# Patient Record
Sex: Female | Born: 1980 | Race: Black or African American | Hispanic: No | Smoking: Former smoker
Health system: Southern US, Community
[De-identification: ages and names within clinical notes are randomized; demographics above are authoritative.]

## PROBLEM LIST (undated history)

## (undated) DIAGNOSIS — D649 Anemia, unspecified: Secondary | ICD-10-CM

## (undated) DIAGNOSIS — R87629 Unspecified abnormal cytological findings in specimens from vagina: Secondary | ICD-10-CM

## (undated) DIAGNOSIS — N871 Moderate cervical dysplasia: Secondary | ICD-10-CM

## (undated) HISTORY — DX: Moderate cervical dysplasia: N87.1

## (undated) HISTORY — PX: LEEP: SHX91

## (undated) HISTORY — DX: Anemia, unspecified: D64.9

## (undated) HISTORY — DX: Unspecified abnormal cytological findings in specimens from vagina: R87.629

## (undated) HISTORY — PX: WISDOM TOOTH EXTRACTION: SHX21

---

## 2006-04-25 ENCOUNTER — Emergency Department (HOSPITAL_COMMUNITY): Admission: EM | Admit: 2006-04-25 | Discharge: 2006-04-25 | Payer: Self-pay | Admitting: Emergency Medicine

## 2008-11-15 ENCOUNTER — Emergency Department (HOSPITAL_BASED_OUTPATIENT_CLINIC_OR_DEPARTMENT_OTHER): Admission: EM | Admit: 2008-11-15 | Discharge: 2008-11-15 | Payer: Self-pay | Admitting: Emergency Medicine

## 2008-11-28 ENCOUNTER — Emergency Department (HOSPITAL_BASED_OUTPATIENT_CLINIC_OR_DEPARTMENT_OTHER): Admission: EM | Admit: 2008-11-28 | Discharge: 2008-11-28 | Payer: Self-pay | Admitting: Emergency Medicine

## 2009-12-14 ENCOUNTER — Emergency Department (HOSPITAL_COMMUNITY): Admission: EM | Admit: 2009-12-14 | Discharge: 2009-12-14 | Payer: Self-pay | Admitting: Emergency Medicine

## 2010-06-19 LAB — URINALYSIS, ROUTINE W REFLEX MICROSCOPIC
Leukocytes, UA: NEGATIVE
Nitrite: NEGATIVE
Protein, ur: NEGATIVE mg/dL
Specific Gravity, Urine: 1.01 (ref 1.005–1.030)
Urobilinogen, UA: 0.2 mg/dL (ref 0.0–1.0)

## 2010-06-19 LAB — WET PREP, GENITAL

## 2010-06-19 LAB — RPR: RPR Ser Ql: NONREACTIVE

## 2010-06-19 LAB — URINE MICROSCOPIC-ADD ON

## 2010-06-19 LAB — GC/CHLAMYDIA PROBE AMP, GENITAL
Chlamydia, DNA Probe: NEGATIVE
GC Probe Amp, Genital: NEGATIVE

## 2010-07-13 LAB — COMPREHENSIVE METABOLIC PANEL WITH GFR
ALT: 14 U/L (ref 0–35)
AST: 33 U/L (ref 0–37)
Albumin: 4.1 g/dL (ref 3.5–5.2)
Alkaline Phosphatase: 93 U/L (ref 39–117)
BUN: 8 mg/dL (ref 6–23)
CO2: 26 meq/L (ref 19–32)
Calcium: 9.5 mg/dL (ref 8.4–10.5)
Chloride: 105 meq/L (ref 96–112)
Creatinine, Ser: 0.8 mg/dL (ref 0.4–1.2)
GFR calc non Af Amer: >60 mL/min
Glucose, Bld: 95 mg/dL (ref 70–99)
Potassium: 3.9 meq/L (ref 3.5–5.1)
Sodium: 141 meq/L (ref 135–145)
Total Bilirubin: 0.3 mg/dL (ref 0.3–1.2)
Total Protein: 8.1 g/dL (ref 6.0–8.3)

## 2010-07-13 LAB — URINALYSIS, ROUTINE W REFLEX MICROSCOPIC
Bilirubin Urine: NEGATIVE
Glucose, UA: NEGATIVE mg/dL
Hgb urine dipstick: NEGATIVE
Ketones, ur: NEGATIVE mg/dL
Nitrite: NEGATIVE
Protein, ur: NEGATIVE mg/dL
Specific Gravity, Urine: 1.008 (ref 1.005–1.030)
Urobilinogen, UA: 0.2 mg/dL (ref 0.0–1.0)
pH: 7.5 (ref 5.0–8.0)

## 2010-07-13 LAB — CBC
HCT: 38.7 % (ref 36.0–46.0)
Hemoglobin: 12.9 g/dL (ref 12.0–15.0)
MCHC: 33.3 g/dL (ref 30.0–36.0)
MCV: 90.5 fL (ref 78.0–100.0)
Platelets: 314 10*3/uL (ref 150–400)
RBC: 4.28 MIL/uL (ref 3.87–5.11)
RDW: 12.1 % (ref 11.5–15.5)
WBC: 7.6 10*3/uL (ref 4.0–10.5)

## 2010-07-13 LAB — DIFFERENTIAL
Basophils Absolute: 0.1 10*3/uL (ref 0.0–0.1)
Basophils Relative: 1 % (ref 0–1)
Lymphocytes Relative: 50 % — ABNORMAL HIGH (ref 12–46)
Neutro Abs: 3.3 10*3/uL (ref 1.7–7.7)
Neutrophils Relative %: 43 % (ref 43–77)

## 2010-07-13 LAB — URINE MICROSCOPIC-ADD ON

## 2010-07-13 LAB — PREGNANCY, URINE: Preg Test, Ur: NEGATIVE

## 2011-12-24 ENCOUNTER — Telehealth (HOSPITAL_COMMUNITY): Payer: Self-pay | Admitting: *Deleted

## 2011-12-24 NOTE — Telephone Encounter (Signed)
Telephoned patient and left message to return call to [facility] 

## 2012-01-05 ENCOUNTER — Ambulatory Visit (HOSPITAL_COMMUNITY)
Admission: RE | Admit: 2012-01-05 | Discharge: 2012-01-05 | Disposition: A | Discharge status: Home / Self Care | Payer: Self-pay | Source: Ambulatory Visit | Attending: Obstetrics and Gynecology | Admitting: Obstetrics and Gynecology

## 2012-01-05 ENCOUNTER — Other Ambulatory Visit: Payer: Self-pay | Admitting: Obstetrics and Gynecology

## 2012-01-05 DIAGNOSIS — Z803 Family history of malignant neoplasm of breast: Secondary | ICD-10-CM

## 2012-01-05 DIAGNOSIS — IMO0002 Reserved for concepts with insufficient information to code with codable children: Secondary | ICD-10-CM

## 2012-01-05 DIAGNOSIS — Z1239 Encounter for other screening for malignant neoplasm of breast: Secondary | ICD-10-CM

## 2012-01-05 DIAGNOSIS — N6314 Unspecified lump in the right breast, lower inner quadrant: Secondary | ICD-10-CM

## 2012-01-11 ENCOUNTER — Ambulatory Visit
Admission: RE | Admit: 2012-01-11 | Discharge: 2012-01-11 | Disposition: A | Discharge status: Home / Self Care | Payer: No Typology Code available for payment source | Source: Ambulatory Visit | Attending: Obstetrics and Gynecology | Admitting: Obstetrics and Gynecology

## 2012-01-11 ENCOUNTER — Other Ambulatory Visit: Payer: Self-pay | Admitting: Obstetrics and Gynecology

## 2012-01-11 DIAGNOSIS — Z803 Family history of malignant neoplasm of breast: Secondary | ICD-10-CM

## 2012-01-11 DIAGNOSIS — N6314 Unspecified lump in the right breast, lower inner quadrant: Secondary | ICD-10-CM

## 2012-01-13 ENCOUNTER — Ambulatory Visit (INDEPENDENT_AMBULATORY_CARE_PROVIDER_SITE_OTHER): Payer: Self-pay | Admitting: Obstetrics & Gynecology

## 2012-01-13 ENCOUNTER — Encounter: Payer: Self-pay | Admitting: Obstetrics & Gynecology

## 2012-01-13 ENCOUNTER — Other Ambulatory Visit (HOSPITAL_COMMUNITY)
Admission: RE | Admit: 2012-01-13 | Discharge: 2012-01-13 | Disposition: A | Discharge status: Home / Self Care | Payer: Medicaid Other | Source: Ambulatory Visit | Attending: Obstetrics & Gynecology | Admitting: Obstetrics & Gynecology

## 2012-01-13 VITALS — BP 113/80 | HR 80 | Temp 97.5°F | Resp 20 | Ht 63.0 in | Wt 171.4 lb

## 2012-01-13 DIAGNOSIS — Z01812 Encounter for preprocedural laboratory examination: Secondary | ICD-10-CM

## 2012-01-13 DIAGNOSIS — R87811 Vaginal high risk human papillomavirus (HPV) DNA test positive: Secondary | ICD-10-CM

## 2012-01-13 DIAGNOSIS — N871 Moderate cervical dysplasia: Secondary | ICD-10-CM | POA: Insufficient documentation

## 2012-01-13 DIAGNOSIS — IMO0002 Reserved for concepts with insufficient information to code with codable children: Secondary | ICD-10-CM | POA: Insufficient documentation

## 2012-01-13 LAB — POCT PREGNANCY, URINE: Preg Test, Ur: NEGATIVE

## 2012-01-13 NOTE — Progress Notes (Signed)
ASCUS pap, +HRHPV in 10/23/11. Patient given informed consent, signed copy in the chart, time out was performed.  Placed in lithotomy position. Cervix viewed with speculum and colposcope after application of acetic acid.   Colposcopy adequate? yes Acetowhite lesions? no visible lesions, no mosaicism, no punctation and no abnormal vasculature ECC specimen obtained and sent to pathology.  Patient was given post procedure instructions.  Will follow up pathology and manage accordingly,

## 2012-01-13 NOTE — Patient Instructions (Signed)

## 2012-01-19 ENCOUNTER — Encounter: Payer: Self-pay | Admitting: Obstetrics & Gynecology

## 2012-01-19 ENCOUNTER — Telehealth: Payer: Self-pay | Admitting: Medical

## 2012-01-19 DIAGNOSIS — N871 Moderate cervical dysplasia: Secondary | ICD-10-CM

## 2012-01-19 HISTORY — DX: Moderate cervical dysplasia: N87.1

## 2012-01-19 NOTE — Telephone Encounter (Signed)
Sent message to admin pool for appointment. They will send back appointment information and then we will need to inform patient of results and appointment time.

## 2012-01-19 NOTE — Progress Notes (Signed)
Quick Note:  Colposcopy pathology shows CIN II. Patient needs to come in soon for counseling about cryotherapy vs LEEP. ______

## 2012-01-19 NOTE — Telephone Encounter (Signed)
Message copied by Freddi Starr on Tue Jan 19, 2012 11:25 AM ------      Message from: Jaynie Collins A      Created: Tue Jan 19, 2012 10:43 AM       Colposcopy pathology shows CIN II. Patient needs to come in soon for counseling about cryotherapy vs LEEP.

## 2012-01-20 ENCOUNTER — Telehealth: Payer: Self-pay | Admitting: *Deleted

## 2012-01-20 NOTE — Telephone Encounter (Signed)
Pt returned call and I informed pt that she had an abnormal colposcopy and that we have an appt set up for 02/15/12 @ 145pm so that she could discuss further options.  Pt stated understanding and did not have any other questions.

## 2012-01-20 NOTE — Telephone Encounter (Signed)
Called pt and was unable to leave a message due to "this caller is not accepting calls at this time, please try your call again later." Called [phone] and left message that we are calling in concern of results and a follow up appt please give [place] a return call back.  Pt has scheduled 02/15/12 @ 145pm for leep vs cryo discussion.

## 2012-01-20 NOTE — Telephone Encounter (Signed)
Message copied by Mannie Stabile on Wed Jan 20, 2012  9:50 AM ------      Message from: [place], VANESSA G      Created: Tue Jan 19, 2012 11:56 AM       The Appt is for February 15, 2012@1 :45pm      ----- Message -----         From: Judith Blonder, MA         Sent: 01/19/2012  11:24 AM           To: [facility] Admin Pool            Please schedule appointment with provider who can discuss cryo vs. LEEP. [facility] would be first choice.             Send message back to clinical pool and we will call patient with results and appointment info.             Thanks

## 2012-01-20 NOTE — Telephone Encounter (Signed)
Called patient to inform her of her upcoming appt, got busy tone. Will try again later.

## 2012-01-25 NOTE — Progress Notes (Signed)
Detailed notes scanned into EPIC under media. 

## 2012-01-25 NOTE — Patient Instructions (Signed)
Detailed notes scanned into EPIC under media. 

## 2012-02-15 ENCOUNTER — Ambulatory Visit (INDEPENDENT_AMBULATORY_CARE_PROVIDER_SITE_OTHER): Payer: No Typology Code available for payment source | Admitting: Obstetrics & Gynecology

## 2012-02-15 ENCOUNTER — Encounter: Payer: Self-pay | Admitting: Obstetrics & Gynecology

## 2012-02-15 VITALS — BP 112/76 | HR 101 | Temp 99.6°F | Ht 63.0 in | Wt 172.3 lb

## 2012-02-15 DIAGNOSIS — N871 Moderate cervical dysplasia: Secondary | ICD-10-CM

## 2012-02-15 NOTE — Progress Notes (Signed)
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 Recommended LEEP vs cryosurgery. Risks and benefits of both modalities discussed in detail. Patient desires LEEP. She watched LEEP video, patient education material given to her to review at home. She will return for LEEP soon.  Total encounter time: 15 minutes.

## 2012-02-15 NOTE — Patient Instructions (Signed)
Loop Electrosurgical Excision Procedure Loop electrosurgical excision procedure (LEEP) is the removal of a portion of the lower part of the uterus (cervix). The procedure is done when there are significantly abnormal cervical cell changes. Abnormal cell changes of the cervix can lead to cancer if left in place and untreated.  The LEEP procedure itself typically only takes a few minutes. Often, it may be done in your caregiver's office. The procedure is considered safe for those who wish to get pregnant or are trying to get pregnant. Only under rare circumstances should this procedure be done if you are pregnant. LET YOUR CAREGIVER KNOW ABOUT:  Whether you are pregnant or late for your last menstrual period.  Allergies to foods or medicines.  All the medicines you are taking includingherbs, eyedrops, and over-the-counter medicines, and creams.  Use of steroids (by mouth or creams).  Previous problems with anesthetics or numbing medicine.  Previous gynecological surgery.  History of blood clots or bleeding problems.  Any recent or current vaginal infections (herpes, sexually transmitted infections).  Other health problems. RISKS AND COMPLICATIONS  Bleeding.  Infection.  Injury to the vagina, bladder, or rectum.  Very rare obstruction of the cervical opening that causes problems during menstruation (cervical stenosis). BEFORE THE PROCEDURE  Do not take aspirin or blood thinners (anticoagulants) for 1 week before the procedure, or as told by your caregiver.  Eat a light meal before the procedure.  Ask your caregiver about changing or stopping your regular medicines.  You may be given a pain reliever 1 or 2 hours before the procedure. PROCEDURE   A tool (speculum) is placed in the vagina. This allows your caregiver to see the cervix.  An iodine stain is applied to the cervix to find the area of abnormal cells to be removed.  Medicine is injected to numb the cervix (local  anesthetic).   Electricity is passed through a thin wire loop which is then used to remove (cauterize) a small segment of the affected cervix.  Light electrocautery is used to seal any small blood vessels and prevent bleeding.  A paste may be applied to the cauterized area of the cervix to help prevent bleeding.  The tissue sample is sent to the lab. It is examined under the microscope. AFTER THE PROCEDURE  Have someone drive you home.  You may have slight to moderate cramping.  You may notice a black vaginal discharge from the paste used on the cervix to prevent bleeding. This is normal.  Watch for excessive bleeding. This requires immediate medical care.  Ask when your test results will be ready. Make sure you get your test results. Document Released: 06/13/2002 Document Revised: 06/15/2011 Document Reviewed: 09/02/2010 ExitCare Patient Information 2013 [employer].  

## 2012-03-16 NOTE — Addendum Note (Signed)
Encounter addended by: Trianna Lupien Poll Tiziana Cislo, RN on: 03/16/2012  2:43 PM<BR>     Documentation filed: Charges VN

## 2012-03-23 ENCOUNTER — Encounter: Payer: Self-pay | Admitting: Obstetrics & Gynecology

## 2012-03-23 ENCOUNTER — Ambulatory Visit (INDEPENDENT_AMBULATORY_CARE_PROVIDER_SITE_OTHER): Payer: Self-pay | Admitting: Obstetrics & Gynecology

## 2012-03-23 ENCOUNTER — Other Ambulatory Visit (HOSPITAL_COMMUNITY)
Admission: RE | Admit: 2012-03-23 | Discharge: 2012-03-23 | Disposition: A | Discharge status: Home / Self Care | Payer: Medicaid Other | Source: Ambulatory Visit | Attending: Obstetrics & Gynecology | Admitting: Obstetrics & Gynecology

## 2012-03-23 VITALS — BP 117/78 | HR 90 | Temp 98.6°F | Ht 63.0 in | Wt 169.0 lb

## 2012-03-23 DIAGNOSIS — N871 Moderate cervical dysplasia: Secondary | ICD-10-CM

## 2012-03-23 DIAGNOSIS — N72 Inflammatory disease of cervix uteri: Secondary | ICD-10-CM | POA: Insufficient documentation

## 2012-03-23 LAB — POCT PREGNANCY, URINE: Preg Test, Ur: NEGATIVE

## 2012-03-23 NOTE — Progress Notes (Signed)
Patient ID: Rachel Brown, female   DOB: [birth year], 32 y.o.   MRN: [id number] Pap smear and colposcopy reviewed.   Pap 10/23/11 ASCUS +HRHPV Colpo Biopsy:  Endocervix, curettage, ECC - DETACHED FRAGMENTS OF SQUAMOUS EPITHELIUM, CONSISTENT WITH HIGH GRADE SQUAMOUS INTRAEPITHELIAL LESION, CIN-II (MODERATE DYSPLASIA). ECC see above Risks, benefits, alternatives, and limitations of procedure explained to patient, including pain, bleeding, infection, failure to remove abnormal tissue and failure to cure dysplasia, need for repeat procedures, damage to pelvic organs, cervical incompetence.  Role of HPV,cervical dysplasia and need for close followup was empasized. Informed written consent was obtained. All questions were answered. Time out performed.  ??Procedure: The patient was placed in lithotomy position and the bivalved coated speculum was placed in the patient's vagina. A grounding pad placed on the patient. Local anesthesia was administered via an intracervical block using 10cc of 2% Lidocaine with epinephrine. The suction was turned on and the Small 1X Fisher Cone Biopsy Excisor on 21 Watts of cutting current was used to excise the area of decreased uptake and excise the entire transformation zone. Excellent hemostasis was achieved using roller ball coagulation set at 60 Watts coagulation current. Monsel's solution was then applied and excellent hemostasis was noted.  The speculum was removed from the vagina. Specimens were sent to pathology. ?The patient tolerated the procedure well. Post-operative instructions given to patient, including instruction to seek medical attention for persistent bright red bleeding, fever, abdominal/pelvic pain, dysuria, nausea or vomiting. She was also told about the possibility of having copious yellow to black tinged discharge. She was counseled to avoid anything in the vagina (sex/douching/tampons) for 4 weeks. She has a  4 week post-operative check to review results and  assess wound healing. Follow up in 4 months for repeat pap or as needed.

## 2012-03-23 NOTE — Patient Instructions (Signed)
Loop Electrosurgical Excision Procedure  Care After  Refer to this sheet in the next few weeks. These instructions provide you with information on caring for yourself after your procedure. Your caregiver may also give you more specific instructions. Your treatment has been planned according to current medical practices, but problems sometimes occur. Call your caregiver if you have any problems or questions after your procedure.  HOME CARE INSTRUCTIONS   · Do not use tampons, douche, or have sexual intercourse for 2 weeks or as directed by your caregiver.  · Begin normal activities if you have no or minimal cramping or bleeding, unless directed otherwise by your caregiver.  · Take your temperature if you feel sick. Write down your temperature on paper, and tell your caregiver if you have a fever.  · Take all medicines as directed by your caregiver.  · Keep all your follow-up appointments and Pap tests as directed by your caregiver.  SEEK IMMEDIATE MEDICAL CARE IF:   · You have bleeding that is heavier or longer than a normal menstrual cycle.  · You have bleeding that is bright red.  · You have blood clots.  · You have a fever.  · You have increasing cramps or pain not relieved by medicine.  · You develop abdominal pain that does not seem to be related to the same area of earlier cramping and pain.  · You are lightheaded, unusually weak, or faint.  · You develop painful or bloody urination.  · You develop a bad smelling vaginal discharge.  MAKE SURE YOU:  · Understand these instructions.  · Will watch your condition.  · Will get help right away if you are not doing well or get worse.  Document Released: 12/04/2010 Document Revised: 06/15/2011 Document Reviewed: 12/04/2010  ExitCare® Patient Information ©2013 [employer].

## 2012-03-28 ENCOUNTER — Telehealth: Payer: Self-pay | Admitting: General Practice

## 2012-03-28 NOTE — Telephone Encounter (Signed)
Called patient and informed her that the results from her LEEP came back with no abnormal cells and that she will just need a PAP in 3 months but to come to her January appt to make sure everything was healing well. Patient verbalized understanding and had no further questions

## 2012-03-28 NOTE — Telephone Encounter (Signed)
Message copied by Kathee Delton on Mon Mar 28, 2012  3:25 PM ------      Message from: Willodean Rosenthal      Created: Mon Mar 28, 2012  3:07 PM       Please call pt.  Notify her that  her biopsy  Showed no abnormal cells.  Schedule pt for a repeat PAP in 3 months!            Thx,      clh-S

## 2012-04-20 ENCOUNTER — Encounter: Payer: Self-pay | Admitting: Obstetrics & Gynecology

## 2012-04-20 ENCOUNTER — Ambulatory Visit (INDEPENDENT_AMBULATORY_CARE_PROVIDER_SITE_OTHER): Payer: Medicaid Other | Admitting: Obstetrics & Gynecology

## 2012-04-20 VITALS — BP 116/79 | HR 65 | Temp 98.0°F | Ht 63.0 in | Wt 170.8 lb

## 2012-04-20 DIAGNOSIS — A499 Bacterial infection, unspecified: Secondary | ICD-10-CM

## 2012-04-20 DIAGNOSIS — N879 Dysplasia of cervix uteri, unspecified: Secondary | ICD-10-CM

## 2012-04-20 DIAGNOSIS — B9689 Other specified bacterial agents as the cause of diseases classified elsewhere: Secondary | ICD-10-CM

## 2012-04-20 DIAGNOSIS — N760 Acute vaginitis: Secondary | ICD-10-CM

## 2012-04-20 MED ORDER — METRONIDAZOLE 500 MG PO TABS: 500.0000 mg | ORAL_TABLET | Freq: Two times a day (BID) | ORAL | Status: DC

## 2012-04-20 NOTE — Progress Notes (Signed)
Subjective:     Patient ID: Rachel Brown, female   DOB: 1981-02-27, 32 y.o.   MRN: [id number]  HPI Pt s/p LEEP 03/23/12.  Pt c/o fishy odor with discharge since the procedure.  Pt denies pain.   Review of Systems     Objective:   Physical ExamBP 116/79  Pulse 65  Temp 98 F (36.7 C) (Oral)  Ht 5\' 3"  (1.6 m)  Wt 170 lb 12.8 oz (77.474 kg)  BMI 30.26 kg/m2  LMP 04/14/2012 GU: EGBUS: no lesions Vagina: no blood in vault Cervix: no lesion; no mucopurulent d/c; cervix still healing  Cervix, LEEP - TRANSFORMATION ZONE MUCOSA WITH ACUTE AND CHRONIC CERVICITIS AND FOCAL REACTIVE CHANGES. - NO DYSPLASIA OR MALIGNANCY IDENTIFIED.      Assessment:     BV S/p LEEP- no dysplasia identified on path specimen.  Will repeat PAP in 3 months    Plan:     Flagyl 500mg  1 po bid x 7 days F/u 3 months for repeat PAP only

## 2012-04-20 NOTE — Patient Instructions (Signed)
Bacterial Vaginosis Bacterial vaginosis (BV) is a vaginal infection where the normal balance of bacteria in the vagina is disrupted. The normal balance is then replaced by an overgrowth of certain bacteria. There are several different kinds of bacteria that can cause BV. BV is the most common vaginal infection in women of childbearing age. CAUSES   The cause of BV is not fully understood. BV develops when there is an increase or imbalance of harmful bacteria.  Some activities or behaviors can upset the normal balance of bacteria in the vagina and put women at increased risk including:  Having a new sex partner or multiple sex partners.  Douching.  Using an intrauterine device (IUD) for contraception.  It is not clear what role sexual activity plays in the development of BV. However, women that have never had sexual intercourse are rarely infected with BV. Women do not get BV from toilet seats, bedding, swimming pools or from touching objects around them.  SYMPTOMS   Grey vaginal discharge.  A fish-like odor with discharge, especially after sexual intercourse.  Itching or burning of the vagina and vulva.  Burning or pain with urination.  Some women have no signs or symptoms at all. DIAGNOSIS  Your caregiver must examine the vagina for signs of BV. Your caregiver will perform lab tests and look at the sample of vaginal fluid through a microscope. They will look for bacteria and abnormal cells (clue cells), a pH test higher than 4.5, and a positive amine test all associated with BV.  RISKS AND COMPLICATIONS   Pelvic inflammatory disease (PID).  Infections following gynecology surgery.  Developing HIV.  Developing herpes virus. TREATMENT  Sometimes BV will clear up without treatment. However, all women with symptoms of BV should be treated to avoid complications, especially if gynecology surgery is planned. Female partners generally do not need to be treated. However, BV may spread  between female sex partners so treatment is helpful in preventing a recurrence of BV.   BV may be treated with antibiotics. The antibiotics come in either pill or vaginal cream forms. Either can be used with nonpregnant or pregnant women, but the recommended dosages differ. These antibiotics are not harmful to the baby.  BV can recur after treatment. If this happens, a second round of antibiotics will often be prescribed.  Treatment is important for pregnant women. If not treated, BV can cause a premature delivery, especially for a pregnant woman who had a premature birth in the past. All pregnant women who have symptoms of BV should be checked and treated.  For chronic reoccurrence of BV, treatment with a type of prescribed gel vaginally twice a week is helpful. HOME CARE INSTRUCTIONS   Finish all medication as directed by your caregiver.  Do not have sex until treatment is completed.  Tell your sexual partner that you have a vaginal infection. They should see their caregiver and be treated if they have problems, such as a mild rash or itching.  Practice safe sex. Use condoms. Only have 1 sex partner. PREVENTION  Basic prevention steps can help reduce the risk of upsetting the natural balance of bacteria in the vagina and developing BV:  Do not have sexual intercourse (be abstinent).  Do not douche.  Use all of the medicine prescribed for treatment of BV, even if the signs and symptoms go away.  Tell your sex partner if you have BV. That way, they can be treated, if needed, to prevent reoccurrence. SEEK MEDICAL CARE IF:     Your symptoms are not improving after 3 days of treatment.  You have increased discharge, pain, or fever. MAKE SURE YOU:   Understand these instructions.  Will watch your condition.  Will get help right away if you are not doing well or get worse. FOR MORE INFORMATION  Division of STD Prevention (DSTDP), Centers for Disease Control and Prevention:  SolutionApps.co.za American Social Health Association (ASHA): www.ashastd.org  Document Released: 03/23/2005 Document Revised: 06/15/2011 Document Reviewed: 09/13/2008 [facility] Patient Information 2013 [place], [place]. Loop Electrosurgical Excision Procedure Care After Refer to this sheet in the next few weeks. These instructions provide you with information on caring for yourself after your procedure. Your caregiver may also give you more specific instructions. Your treatment has been planned according to current medical practices, but problems sometimes occur. Call your caregiver if you have any problems or questions after your procedure. HOME CARE INSTRUCTIONS   Do not use tampons, douche, or have sexual intercourse for 2 weeks or as directed by your caregiver.  Begin normal activities if you have no or minimal cramping or bleeding, unless directed otherwise by your caregiver.  Take your temperature if you feel sick. Write down your temperature on paper, and tell your caregiver if you have a fever.  Take all medicines as directed by your caregiver.  Keep all your follow-up appointments and Pap tests as directed by your caregiver. SEEK IMMEDIATE MEDICAL CARE IF:   You have bleeding that is heavier or longer than a normal menstrual cycle.  You have bleeding that is bright red.  You have blood clots.  You have a fever.  You have increasing cramps or pain not relieved by medicine.  You develop abdominal pain that does not seem to be related to the same area of earlier cramping and pain.  You are lightheaded, unusually weak, or faint.  You develop painful or bloody urination.  You develop a bad smelling vaginal discharge. MAKE SURE YOU:  Understand these instructions.  Will watch your condition.  Will get help right away if you are not doing well or get worse. Document Released: 12/04/2010 Document Revised: 06/15/2011 Document Reviewed: 12/04/2010 [facility] Patient Information  2013 [place], [place].

## 2012-08-04 ENCOUNTER — Ambulatory Visit (INDEPENDENT_AMBULATORY_CARE_PROVIDER_SITE_OTHER): Payer: Medicaid Other | Admitting: Obstetrics & Gynecology

## 2012-08-04 ENCOUNTER — Encounter: Payer: Self-pay | Admitting: Obstetrics & Gynecology

## 2012-08-04 VITALS — BP 120/84 | HR 106 | Temp 98.4°F | Ht 63.0 in | Wt 170.7 lb

## 2012-08-04 DIAGNOSIS — R8789 Other abnormal findings in specimens from female genital organs: Secondary | ICD-10-CM

## 2012-08-04 DIAGNOSIS — Z9889 Other specified postprocedural states: Secondary | ICD-10-CM

## 2012-08-04 NOTE — Progress Notes (Signed)
Subjective:     Patient ID: Rachel Brown, female   DOB: 06-Jun-1980, 32 y.o.   MRN: [id number]  HPI  Pt s/p LEEP which showed no dysplasia.  She is here for a repeat PAP.  Pt sexually active and wants STI screen.   Review of Systems     Objective:   Physical Exam BP 120/84  Pulse 106  Temp(Src) 98.4 F (36.9 C) (Oral)  Ht 5\' 3"  (1.6 m)  Wt 170 lb 11.2 oz (77.429 kg)  BMI 30.25 kg/m2  LMP 07/08/2012 Pt in NAD GU: EGBUS: no lesions Vagina: no blood in vault Cervix: no lesion; no mucopurulent d/c  Pap 10/23/11 ASCUS +HRHPV  Colpo Biopsy:  Endocervix, curettage, ECC  - DETACHED FRAGMENTS OF SQUAMOUS EPITHELIUM, CONSISTENT WITH HIGH GRADE  SQUAMOUS INTRAEPITHELIAL LESION, CIN-II (MODERATE DYSPLASIA).   03/23/12 Diagnosis Cervix, LEEP - TRANSFORMATION ZONE MUCOSA WITH ACUTE AND CHRONIC CERVICITIS AND FOCAL REACTIVE CHANGES. - NO DYSPLASIA OR MALIGNANCY IDENTIFIED.     Assessment:     F/u abnormal PAP/colpo- LEEP path was not consistent with ECC.   STI screen     Plan:     F/u PAP and cx F/u 1 year annual if PAP normal

## 2012-08-04 NOTE — Patient Instructions (Signed)
Abnormal Pap Test Information During a Pap test, the cells on the surface of your cervix are checked to see if they look normal, abnormal, or if they show signs of having been altered by a certain type of virus called human papillomavirus, or HPV. Cervical cells that have been affected by HPV are called dysplasia. Dysplasia is not cancer, but describes abnormal cells found on the surface of the cervix. Depending on the degree of dysplasia, some of the cells may be considered pre-cancerous and may turn into cancer over time if follow up with a caregiver is delayed.  WHAT DOES AN ABNORMAL PAP TEST MEAN? Having an abnormal pap test does not mean that you have cancer. However, certain types of abnormal pap tests can be a sign that a person is at a higher risk of developing cancer. Your caregiver will want to do other tests to find out more about the abnormal cells. Your abnormal Pap test results could show:   Small and uncertain changes that should be carefully watched.   Cervical dysplasia that has caused mild changes and can be followed over time.  Cervical dysplasia that is more severe and needs to be followed and treated to ensure the problem goes away.  Cancer.  When severe cervical dysplasia is found and treated early, it rarely will grow into cancer.  WHAT WILL BE DONE ABOUT MY ABNORMAL PAP TEST?  A colposcopy may be needed. This is a procedure where your cervix is examined using light and magnification.  A small tissue sample of your cervix (biopsy) may need to be removed and then examined. This is often performed if there are areas that appear infected.  A sample of cells from the cervical canal may be removed with either a small brush or scraping instrument (curette). Based on the results of the procedures above, some caregivers may recommend either cryotherapy of the cervix or a surgical LEEP where a portion of the cervix is removed. LEEP is short for "loop electrical excisional  procedure." Rarely, a caregiver may recommend a cone biopsy.This is a procedure where a small, cone-shaped sample of your cervix is taken out. The part that is taken out is the area where the abnormal cells are.  WHAT IF I HAVE A DYSPLASIA OR A CANCER? You may be referred to a specialist. Radiation may also be a treatment for more advanced cancer. Having a hysterectomy is the last treatment option for dysplasia, but it is a more common treatment for someone with cancer. All treatment options will be discussed with you by your caregiver. WHAT SHOULD YOU DO AFTER BEING TREATED? If you have had an abnormal pap test, you should continue to have regular pap tests and check-ups as directed by your caregiver. Your cervical problem will be carefully watched so it does not get worse. Also, your caregiver can watch for, and treat, any new problems that may come up. Document Released: 07/08/2010 Document Revised: 06/15/2011 Document Reviewed: 03/19/2011 [facility] Patient Information 2013 [place], [place]. Sexually Transmitted Disease Sexually transmitted disease (STD) refers to any infection that is passed from person to person during sexual activity. This may happen by way of saliva, semen, blood, vaginal mucus, or urine. Common STDs include:  Gonorrhea.  Chlamydia.  Syphilis.  HIV/AIDS.  Genital herpes.  Hepatitis B and C.  Trichomonas.  Human papillomavirus (HPV).  Pubic lice. CAUSES  An STD may be spread by bacteria, virus, or parasite. A person can get an STD by:  Sexual intercourse with an infected  person.  Sharing sex toys with an infected person.  Sharing needles with an infected person.  Having intimate contact with the genitals, mouth, or rectal areas of an infected person. SYMPTOMS  Some people may not have any symptoms, but they can still pass the infection to others. Different STDs have different symptoms. Symptoms include:  Painful or bloody urination.  Pain in the  pelvis, abdomen, vagina, anus, throat, or eyes.  Skin rash, itching, irritation, growths, or sores (lesions). These usually occur in the genital or anal area.  Abnormal vaginal discharge.  Penile discharge in men.  Soft, flesh-colored skin growths in the genital or anal area.  Fever.  Pain or bleeding during sexual intercourse.  Swollen glands in the groin area.  Yellow skin and eyes (jaundice). This is seen with hepatitis. DIAGNOSIS  To make a diagnosis, your caregiver may:  Take a medical history.  Perform a physical exam.  Take a specimen (culture) to be examined.  Examine a sample of discharge under a microscope.  Perform blood tests.  Perform a Pap test, if this applies.  Perform a colposcopy.  Perform a laparoscopy. TREATMENT   Chlamydia, gonorrhea, trichomonas, and syphilis can be cured with antibiotic medicine.  Genital herpes, hepatitis, and HIV can be treated, but not cured, with prescribed medicines. The medicines will lessen the symptoms.  Genital warts from HPV can be treated with medicine or by freezing, burning (electrocautery), or surgery. Warts may come back.  HPV is a virus and cannot be cured with medicine or surgery.However, abnormal areas may be followed very closely by your caregiver and may be removed from the cervix, vagina, or vulva through office procedures or surgery. If your diagnosis is confirmed, your recent sexual partners need treatment. This is true even if they are symptom-free or have a negative culture or evaluation. They should not have sex until their caregiver says it is okay. HOME CARE INSTRUCTIONS  All sexual partners should be informed, tested, and treated for all STDs.  Take your antibiotics as directed. Finish them even if you start to feel better.  Only take over-the-counter or prescription medicines for pain, discomfort, or fever as directed by your caregiver.  Rest.  Eat a balanced diet and drink enough fluids to  keep your urine clear or pale yellow.  Do not have sex until treatment is completed and you have followed up with your caregiver. STDs should be checked after treatment.  Keep all follow-up appointments, Pap tests, and blood tests as directed by your caregiver.  Only use latex condoms and water-soluble lubricants during sexual activity. Do not use petroleum jelly or oils.  Avoid alcohol and illegal drugs.  Get vaccinated for HPV and hepatitis. If you have not received these vaccines in the past, talk to your caregiver about whether one or both might be right for you.  Avoid risky sex practices that can break the skin. The only way to avoid getting an STD is to avoid all sexual activity.Latex condoms and dental dams (for oral sex) will help lessen the risk of getting an STD, but will not completely eliminate the risk. SEEK MEDICAL CARE IF:   You have a fever.  You have any new or worsening symptoms. Document Released: 06/13/2002 Document Revised: 06/15/2011 Document Reviewed: 06/20/2010 [facility] Patient Information 2013 [place], [place].

## 2013-09-24 ENCOUNTER — Encounter (HOSPITAL_BASED_OUTPATIENT_CLINIC_OR_DEPARTMENT_OTHER): Payer: Self-pay | Admitting: Emergency Medicine

## 2013-09-24 ENCOUNTER — Emergency Department (HOSPITAL_BASED_OUTPATIENT_CLINIC_OR_DEPARTMENT_OTHER)
Admission: EM | Admit: 2013-09-24 | Discharge: 2013-09-24 | Disposition: A | Discharge status: Home / Self Care | Payer: BC Managed Care – PPO | Attending: Emergency Medicine | Admitting: Emergency Medicine

## 2013-09-24 DIAGNOSIS — IMO0002 Reserved for concepts with insufficient information to code with codable children: Secondary | ICD-10-CM | POA: Insufficient documentation

## 2013-09-24 DIAGNOSIS — Z8742 Personal history of other diseases of the female genital tract: Secondary | ICD-10-CM | POA: Insufficient documentation

## 2013-09-24 DIAGNOSIS — Z91040 Latex allergy status: Secondary | ICD-10-CM | POA: Insufficient documentation

## 2013-09-24 DIAGNOSIS — R110 Nausea: Secondary | ICD-10-CM | POA: Insufficient documentation

## 2013-09-24 DIAGNOSIS — Z79899 Other long term (current) drug therapy: Secondary | ICD-10-CM | POA: Insufficient documentation

## 2013-09-24 DIAGNOSIS — Z87891 Personal history of nicotine dependence: Secondary | ICD-10-CM | POA: Insufficient documentation

## 2013-09-24 DIAGNOSIS — L03011 Cellulitis of right finger: Secondary | ICD-10-CM

## 2013-09-24 DIAGNOSIS — Z862 Personal history of diseases of the blood and blood-forming organs and certain disorders involving the immune mechanism: Secondary | ICD-10-CM | POA: Insufficient documentation

## 2013-09-24 MED ORDER — SULFAMETHOXAZOLE-TRIMETHOPRIM 800-160 MG PO TABS
1.0000 | ORAL_TABLET | Freq: Two times a day (BID) | ORAL | Status: AC
Start: 2013-09-24 — End: 2013-10-01

## 2013-09-24 MED ORDER — TERBINAFINE HCL 250 MG PO TABS: 250.0000 mg | ORAL_TABLET | Freq: Every day | ORAL | Status: DC

## 2013-09-24 MED ORDER — HYDROCODONE-ACETAMINOPHEN 5-325 MG PO TABS: 2.0000 | ORAL_TABLET | ORAL | Status: DC | PRN

## 2013-09-24 NOTE — ED Notes (Signed)
Patient has been having right thumb pain that has been progressing. States this morning she went to her nail salon to have her gel nail removed and she noticed her thumbnail was discolored. Pain started 2 days ago  And now shoots up her hand.

## 2013-09-24 NOTE — ED Provider Notes (Signed)
CSN: [id number]     Arrival date & time 09/24/13  1322 History   First MD Initiated Contact with Patient 09/24/13 1329     Chief Complaint  Patient presents with  . Nail Problem     (Consider location/radiation/quality/duration/timing/severity/associated sxs/prior Treatment) Patient is a 33 y.o. female presenting with hand pain. The history is provided by the patient. No language interpreter was used.  Hand Pain This is a new problem. The current episode started in the past 7 days. Associated symptoms include nausea. Pertinent negatives include no chills. Nothing aggravates the symptoms. She has tried nothing for the symptoms. The treatment provided moderate relief.  Pt has white streak under nail.   Pt reports paionful  Past Medical History  Diagnosis Date  . Anemia     history  . 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   History reviewed. No pertinent past surgical history. Family History  Problem Relation Age of Onset  . Stroke Father   . Hypertension Father   . Heart disease Father   . Liver disease Father   . Cancer Mother     breast  . Asthma Sister    History  Substance Use Topics  . Smoking status: Former Smoker    Quit date: 11/24/2006  . Smokeless tobacco: Not on file  . Alcohol Use: Yes     Comment: occasionally   OB History   Grav Para Term Preterm Abortions TAB SAB Ect Mult Living   0              Review of Systems  Constitutional: Negative for chills.  Gastrointestinal: Positive for nausea.      Allergies  Asa; Latex; Nsaids; and Peanuts  Home Medications   Prior to Admission medications   Medication Sig Start Date End Date Taking? Authorizing Provider  etonogestrel-ethinyl estradiol (NUVARING) 0.12-0.015 MG/24HR vaginal ring Place 1 each vaginally every 28 (twenty-eight) days. Insert vaginally and leave in place for 3 consecutive weeks, then remove for 1 week.    Historical  Provider, MD  metroNIDAZOLE (FLAGYL) 500 MG tablet Take 1 tablet (500 mg total) by mouth 2 (two) times daily. 04/20/12   Willodean Rosenthalarolyn Harraway-Smith, MD  sulfamethoxazole-trimethoprim (BACTRIM DS,SEPTRA DS) 800-160 MG per tablet Take 1 tablet by mouth 2 (two) times daily. 09/24/13 10/01/13  Elson AreasLeslie K Sofia, PA-C  terbinafine (LAMISIL) 250 MG tablet Take 1 tablet (250 mg total) by mouth daily. 09/24/13   Elson AreasLeslie K Sofia, PA-C  triamcinolone ointment (KENALOG) 0.1 % Apply 1 application topically 2 (two) times daily.    Historical Provider, MD   BP 140/80  Pulse 90  Temp(Src) 98.4 F (36.9 C) (Oral)  Resp 18  Ht 5\' 3"  (1.6 m)  Wt 173 lb (78.472 kg)  BMI 30.65 kg/m2  SpO2 99%  LMP 09/04/2013 Physical Exam  Constitutional: She appears well-developed.  HENT:  Head: Normocephalic.  Musculoskeletal: She exhibits tenderness.  White streak under nail,  Finger swollen  Neurological: She is alert.  Skin: Skin is warm.  Psychiatric: She has a normal mood and affect.    ED Course  Procedures (including critical care time) Labs Review Labs Reviewed - No data to display  Imaging Review No results found.   EKG Interpretation None      MDM I suspect fungal infection,   I am going to cover with Bactrim and start Lamiseal   Final diagnoses:  Infection of nail bed of  finger, right    Bactrim and lamiseal    Elson AreasLeslie K Sofia, PA-C 09/24/13 1403

## 2013-09-24 NOTE — Discharge Instructions (Signed)
Wound Infection °A wound infection happens when a type of germ (bacteria) starts growing in the wound. In some cases, this can cause the wound to break open. If cared for properly, the infected wound will heal from the inside to the outside. Wound infections need treatment. °CAUSES °An infection is caused by bacteria growing in the wound.  °SYMPTOMS  °· Increase in redness, swelling, or pain at the wound site. °· Increase in drainage at the wound site. °· Wound or bandage (dressing) starts to smell bad. °· Fever. °· Feeling tired or fatigued. °· Pus draining from the wound. °TREATMENT  °Your health care provider will prescribe antibiotic medicine. The wound infection should improve within 24 to 48 hours. Any redness around the wound should stop spreading and the wound should be less painful.  °HOME CARE INSTRUCTIONS  °· Only take over-the-counter or prescription medicines for pain, discomfort, or fever as directed by your health care provider. °· Take your antibiotics as directed. Finish them even if you start to feel better. °· Gently wash the area with mild soap and water 2 times a day, or as directed. Rinse off the soap. Pat the area dry with a clean towel. Do not rub the wound. This may cause bleeding. °· Follow your health care provider's instructions for how often you need to change the dressing. °· Apply ointment and a dressing to the wound as directed. °· If the dressing sticks, moisten it with soapy water and gently remove it. °· Change the bandage right away if it becomes wet, dirty, or develops a bad smell. °· Take showers. Do not take tub baths, swim, or do anything that may soak the wound until it is healed. °· Avoid exercises that make you sweat heavily. °· Use anti-itch medicine as directed by your health care provider. The wound may itch when it is healing. Do not pick or scratch at the wound. °· Follow up with your health care provider to get your wound rechecked as directed. °SEEK MEDICAL CARE  IF: °· You have an increase in swelling, pain, or redness around the wound. °· You have an increase in the amount of pus coming from the wound. °· There is a bad smell coming from the wound. °· More of the wound breaks open. °· You have a fever. °MAKE SURE YOU:  °· Understand these instructions. °· Will watch your condition. °· Will get help right away if you are not doing well or get worse. °Document Released: 12/20/2002 Document Revised: 03/28/2013 Document Reviewed: 07/27/2010 °[employer]® Patient Information ©2015 [employer]. This information is not intended to replace advice given to you by your health care provider. Make sure you discuss any questions you have with your health care provider. ° °

## 2013-09-24 NOTE — ED Provider Notes (Signed)
Medical screening examination/treatment/procedure(s) were performed by non-physician practitioner and as supervising physician I was immediately available for consultation/collaboration.     Geoffery Lyonsouglas Delo, MD 09/24/13 [phone]

## 2013-09-27 ENCOUNTER — Encounter (HOSPITAL_COMMUNITY): Payer: Self-pay | Admitting: Emergency Medicine

## 2013-09-27 ENCOUNTER — Telehealth (HOSPITAL_BASED_OUTPATIENT_CLINIC_OR_DEPARTMENT_OTHER): Payer: Self-pay

## 2013-09-27 ENCOUNTER — Emergency Department (INDEPENDENT_AMBULATORY_CARE_PROVIDER_SITE_OTHER)
Admission: EM | Admit: 2013-09-27 | Discharge: 2013-09-27 | Disposition: A | Discharge status: Home / Self Care | Payer: BC Managed Care – PPO | Source: Home / Self Care | Attending: Emergency Medicine | Admitting: Emergency Medicine

## 2013-09-27 DIAGNOSIS — L03011 Cellulitis of right finger: Secondary | ICD-10-CM

## 2013-09-27 DIAGNOSIS — L03019 Cellulitis of unspecified finger: Secondary | ICD-10-CM

## 2013-09-27 MED ORDER — TERBINAFINE HCL 250 MG PO TABS: 250.0000 mg | ORAL_TABLET | Freq: Every day | ORAL | Status: DC

## 2013-09-27 NOTE — Telephone Encounter (Signed)
Pharmacy calling regarding Rx.  [facility]

## 2013-09-27 NOTE — ED Notes (Signed)
Patient c/o feeling lightheaded post procedure.  Reclined patient and provided ice/coke.  Reports feling better.  Continues to soak finger in betadine

## 2013-09-27 NOTE — ED Provider Notes (Addendum)
CSN: [id number]     Arrival date & time 09/27/13  0944 History   First MD Initiated Contact with Patient 09/27/13 1001     Chief Complaint  Patient presents with  . Follow-up   (Consider location/radiation/quality/duration/timing/severity/associated sxs/prior Treatment) Patient is a 33 y.o. female presenting with hand pain. The history is provided by the patient. No language interpreter was used.  Hand Pain This is a recurrent problem. The problem occurs every several days. Nothing aggravates the symptoms. Nothing relieves the symptoms.  Pt here for recheck with me.   Pt has had decreased swelling of finger,  But nail looks worse.  Pt unable to get lamiseal  Past Medical History  Diagnosis Date  . Anemia     history  . 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   History reviewed. No pertinent past surgical history. Family History  Problem Relation Age of Onset  . Stroke Father   . Hypertension Father   . Heart disease Father   . Liver disease Father   . Cancer Mother     breast  . Asthma Sister    History  Substance Use Topics  . Smoking status: Former Smoker    Quit date: 11/24/2006  . Smokeless tobacco: Not on file  . Alcohol Use: Yes     Comment: occasionally   OB History   Grav Para Term Preterm Abortions TAB SAB Ect Mult Living   0              Review of Systems  Skin: Positive for wound.  All other systems reviewed and are negative.   Allergies  Asa; Latex; Nsaids; and Peanuts  Home Medications   Prior to Admission medications   Medication Sig Start Date End Date Taking? Authorizing Provider  sulfamethoxazole-trimethoprim (BACTRIM DS,SEPTRA DS) 800-160 MG per tablet Take 1 tablet by mouth 2 (two) times daily. 09/24/13 10/01/13 Yes Elson AreasLeslie K Sofia, PA-C  etonogestrel-ethinyl estradiol (NUVARING) 0.12-0.015 MG/24HR vaginal ring Place 1 each vaginally every 28 (twenty-eight) days. Insert  vaginally and leave in place for 3 consecutive weeks, then remove for 1 week.    Historical Provider, MD  HYDROcodone-acetaminophen (NORCO/VICODIN) 5-325 MG per tablet Take 2 tablets by mouth every 4 (four) hours as needed. 09/24/13   Elson AreasLeslie K Sofia, PA-C  metroNIDAZOLE (FLAGYL) 500 MG tablet Take 1 tablet (500 mg total) by mouth 2 (two) times daily. 04/20/12   Willodean Rosenthalarolyn Harraway-Smith, MD  terbinafine (LAMISIL) 250 MG tablet Take 1 tablet (250 mg total) by mouth daily. 09/27/13   Elson AreasLeslie K Sofia, PA-C  triamcinolone ointment (KENALOG) 0.1 % Apply 1 application topically 2 (two) times daily.    Historical Provider, MD   BP 119/87  Pulse 73  Temp(Src) 97.7 F (36.5 C) (Oral)  Resp 16  SpO2 98%  LMP 08/31/2013 Physical Exam  Nursing note reviewed. Constitutional: She is oriented to person, place, and time.  Musculoskeletal: She exhibits tenderness.  Neurological: She is alert and oriented to person, place, and time.  Skin: Skin is warm.    ED Course  INCISION AND DRAINAGE Date/Time: 09/27/2013 11:45 AM Performed by: Elson AreasSOFIA, LESLIE K Authorized by: Elson AreasSOFIA, LESLIE K Consent: Verbal consent not obtained. Risks and benefits: risks, benefits and alternatives were discussed Consent given by: patient Patient identity confirmed: verbally with patient Type: abscess Body area: upper extremity Anesthesia: local infiltration Local anesthetic: topical anesthetic and lidocaine spray Needle gauge: 18 Incision type: single  straight Complexity: simple Drainage: purulent Patient tolerance: Patient tolerated the procedure well with no immediate complications.   (including critical care time) Labs Review Labs Reviewed - No data to display  Imaging Review No results found. Swollen area at base of nail  MDM   1. Paronychia of finger of right hand    Continue bactrim rx for lamiseal,  [facility]    Leslie K Sofia, PA-C 09/27/13 1148  [address]Leslie K SaugatuckSofia, PA-C 09/27/13  1148  [address]Leslie K Windsor HeightsSofia, [place] JerseyPA-C 10/02/13 1724

## 2013-09-27 NOTE — ED Notes (Signed)
Patient instructed to return for follow up.  Seen at [facility] on 6/21.

## 2013-09-27 NOTE — Discharge Instructions (Signed)

## 2013-09-28 NOTE — ED Provider Notes (Signed)
Medical screening examination/treatment/procedure(s) were performed by a resident physician or non-physician practitioner and as the supervising physician I was immediately available for consultation/collaboration.  Circe Chilton, MD    Yacqub Baston S Lamine Laton, MD 09/28/13 0728 

## 2013-10-03 NOTE — ED Provider Notes (Signed)
Medical screening examination/treatment/procedure(s) were performed by a resident physician or non-physician practitioner and as the supervising physician I was immediately available for consultation/collaboration.  Clementeen GrahamEvan Corey, MD    Rodolph BongEvan S Corey, MD 10/03/13 [phone]

## 2013-11-30 ENCOUNTER — Other Ambulatory Visit: Payer: Self-pay

## 2013-12-01 LAB — CYTOLOGY - PAP

## 2014-09-27 IMAGING — MG MM DIGITAL DIAGNOSTIC BILAT
5 series · 5 of 5 positions shown · non-contrast
Comparison: None.

CLINICAL DATA: Palpable abnormality in the right breast.  The
patient has a strong family history breast cancer (the patient's
mother was diagnosed with breast cancer at the age of 39).

DIGITAL DIAGNOSTIC BILATERAL MAMMOGRAM WITH CAD AND RIGHT BREAST
ULTRASOUND:

[R CC]
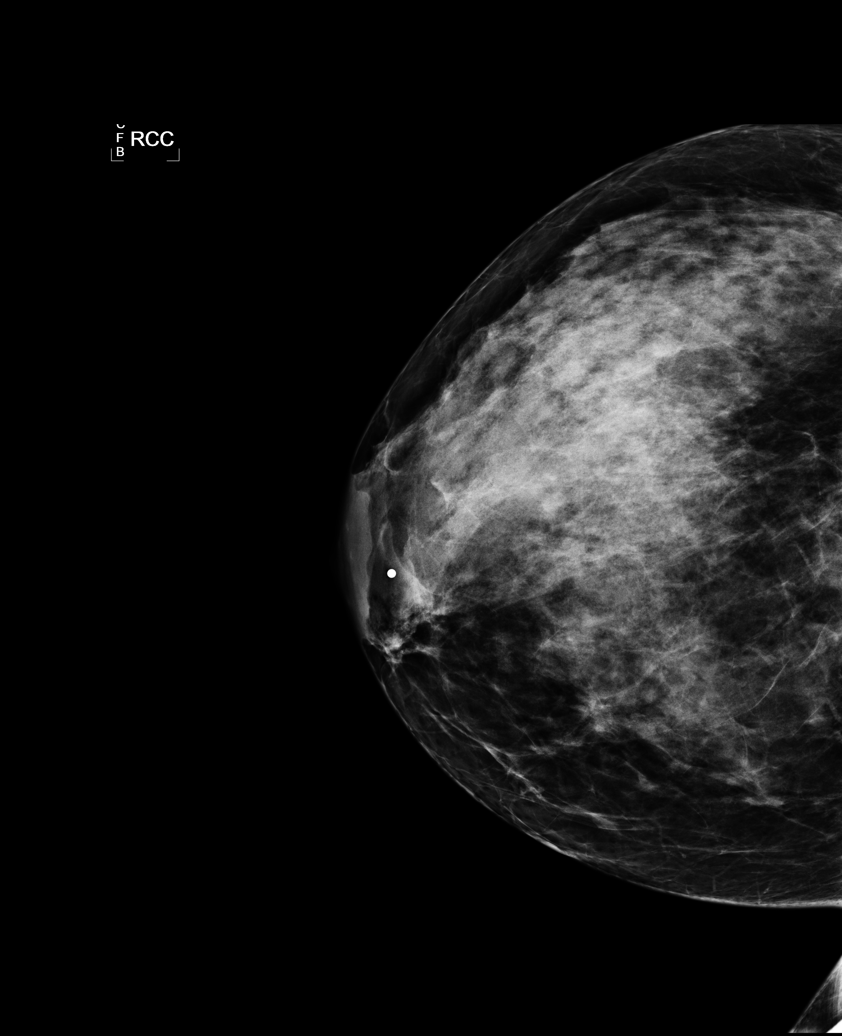

[L CC]
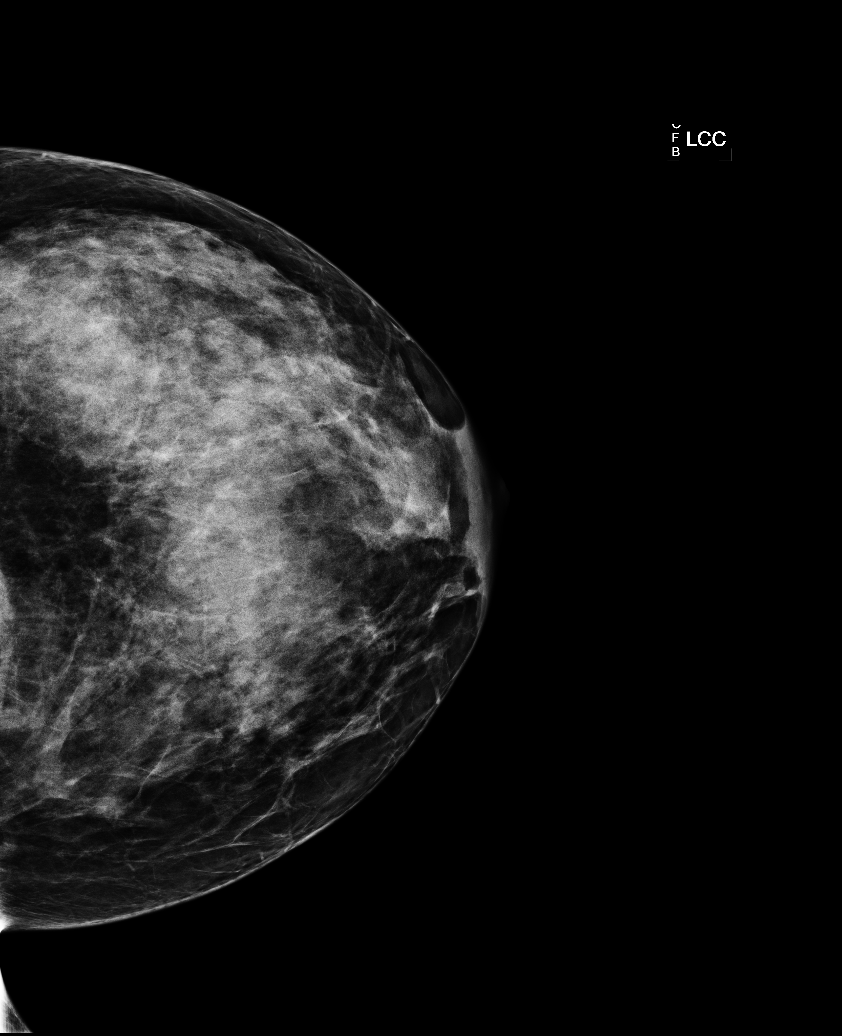

[L MLO]
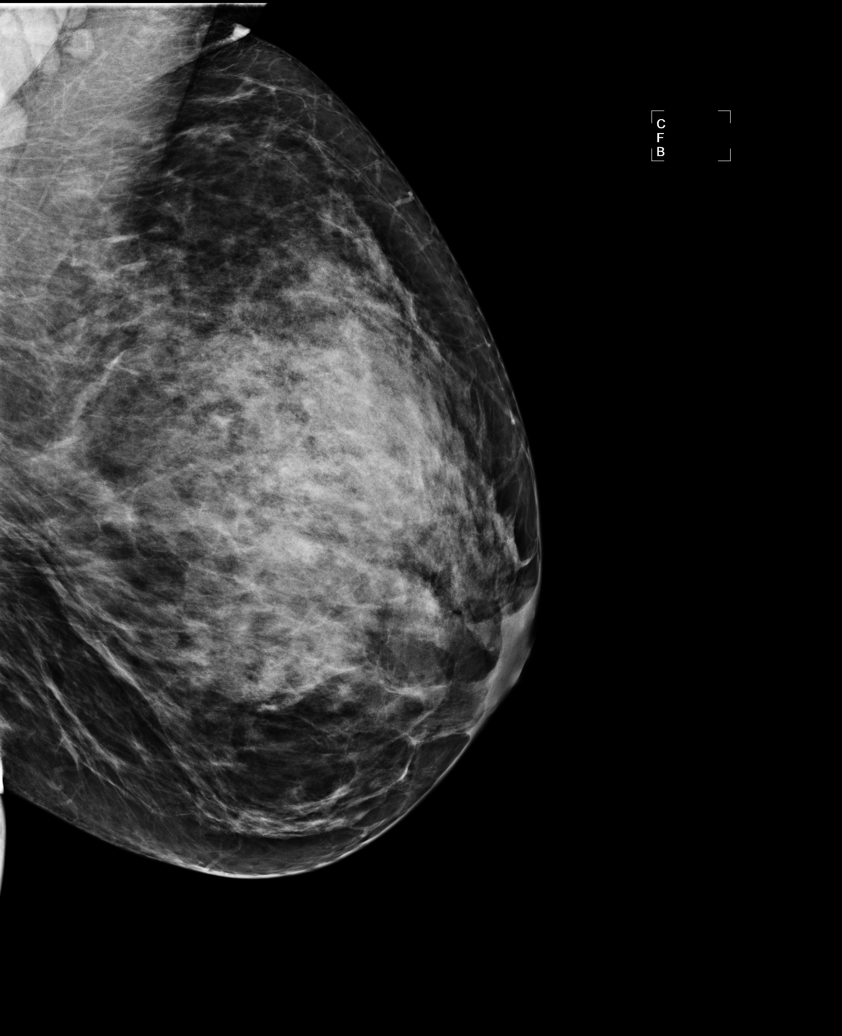

[R MLO]
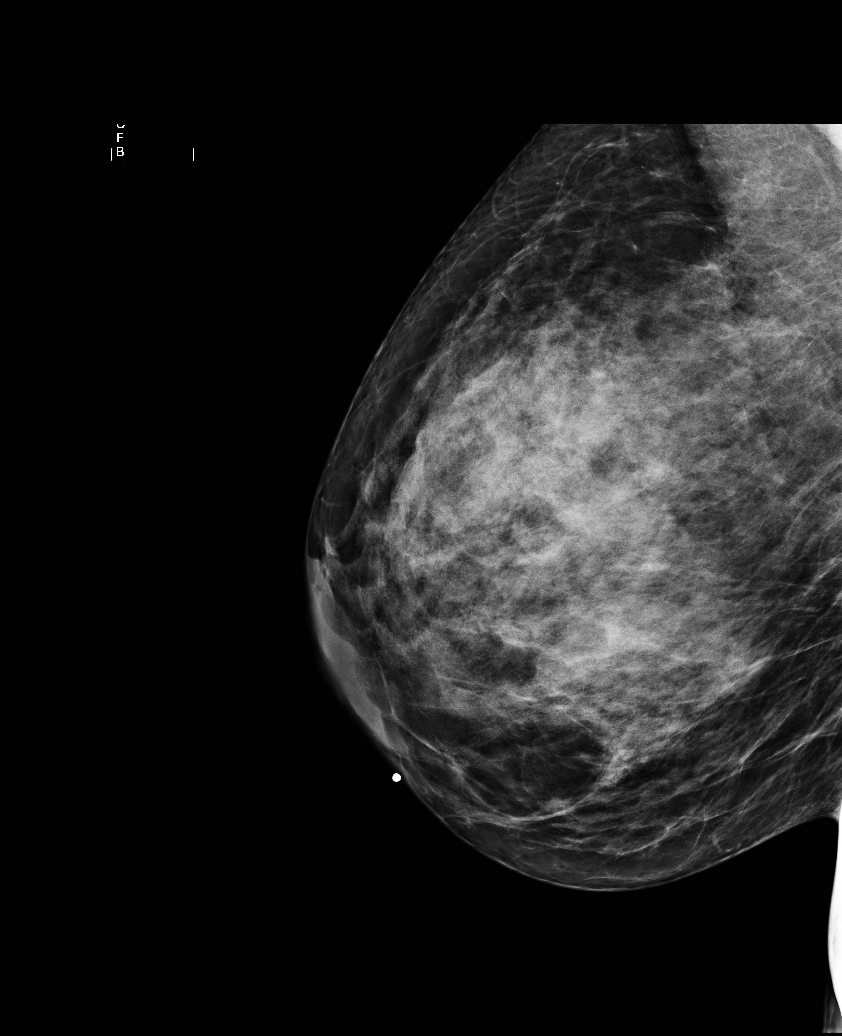

[R TAN]
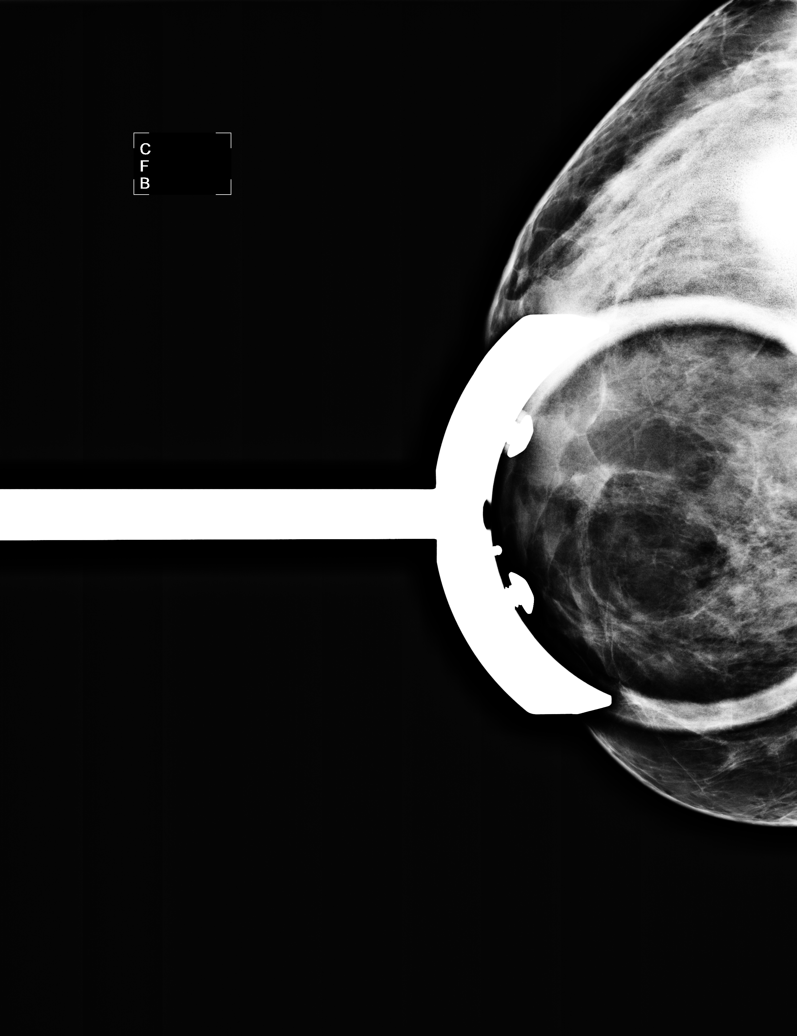

[5 of 5 positions shown; findings below may reference images not displayed]

FINDINGS: There is a dense fibroglandular pattern.  There is no
suspicious mass, or malignant-type microcalcifications.  Spot
tangential view of the area of clinical concern in the right breast
is negative.
Mammographic images were processed with CAD.

On physical exam, I do not palpate a discrete mass in the right
breast.

Ultrasound is performed, showing normal tissue in the area of
clinical concern at 5 o'clock in the subareolar location.  No solid
or cystic mass, abnormal shadowing or distortion detected.
IMPRESSION: No evidence of malignancy in either breast.  Bilateral screening
mammogram in 1 year is recommended in this patient with a strong
family history of breast cancer.  Screening MRI every one to two
year should be considered given the dense fibroglandular pattern
and strong family history of breast cancer.  The importance of self
breast examination was discussed with the patient.

RECOMMENDATION:
Screening mammogram in 1 year.

BI-RADS CATEGORY 1:  Negative.

## 2014-12-03 ENCOUNTER — Other Ambulatory Visit: Payer: Self-pay

## 2014-12-04 LAB — CYTOLOGY - PAP

## 2015-12-12 ENCOUNTER — Other Ambulatory Visit: Payer: Self-pay | Admitting: Obstetrics

## 2016-07-02 ENCOUNTER — Emergency Department (HOSPITAL_COMMUNITY)
Admission: EM | Admit: 2016-07-02 | Discharge: 2016-07-02 | Discharge status: Absent without leave | Payer: BLUE CROSS/BLUE SHIELD

## 2018-01-21 ENCOUNTER — Ambulatory Visit (HOSPITAL_COMMUNITY)
Admission: EM | Admit: 2018-01-21 | Discharge: 2018-01-21 | Disposition: A | Discharge status: Home / Self Care | Payer: BLUE CROSS/BLUE SHIELD | Attending: Family Medicine | Admitting: Family Medicine

## 2018-01-21 ENCOUNTER — Encounter (HOSPITAL_COMMUNITY): Payer: Self-pay | Admitting: Emergency Medicine

## 2018-01-21 ENCOUNTER — Other Ambulatory Visit: Payer: Self-pay

## 2018-01-21 DIAGNOSIS — T169XXA Foreign body in ear, unspecified ear, initial encounter: Secondary | ICD-10-CM | POA: Diagnosis not present

## 2018-01-21 NOTE — ED Provider Notes (Signed)
[facility]    CSN: [id number] Arrival date & time: 01/21/18  1659     History   Chief Complaint Chief Complaint  Patient presents with  . foreign object in ear    HPI Rachel Brown is a 37 y.o. female no contributing past medical history presenting today for possible foreign body in her ear.  Patient states that she woke up this morning and was missing the ball on the end of her tragus piercing.  She did not find it in her bed or on the floor and was concerned that this was in her ear.  That the day she has noticed a discomfort to her ear.  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  HPI  Past Medical History:  Diagnosis Date  . Anemia    history  . 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    Patient Active Problem List   Diagnosis Date Noted  . 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 Moderate dysplasia of cervix (CIN II) 01/13/2012    History reviewed. No pertinent surgical history.  OB History    Gravida  0   Para      Term      Preterm      AB      Living        SAB      TAB      Ectopic      Multiple      Live Births               Home Medications    Prior to Admission medications   Medication Sig Start Date End Date Taking? Authorizing Provider  etonogestrel-ethinyl estradiol (NUVARING) 0.12-0.015 MG/24HR vaginal ring Place 1 each vaginally every 28 (twenty-eight) days. Insert vaginally and leave in place for 3 consecutive weeks, then remove for 1 week.   Yes [provider]  HYDROcodone-acetaminophen (NORCO/VICODIN) 5-325 MG per tablet Take 2 tablets by mouth every 4 (four) hours as needed. 09/24/13   Elson Areas, PA-C  metroNIDAZOLE (FLAGYL) 500 MG tablet Take 1 tablet (500 mg total) by mouth 2 (two) times daily. 04/20/12   Willodean Rosenthal, MD  terbinafine (LAMISIL) 250 MG tablet Take 1 tablet (250 mg  total) by mouth daily. 09/27/13   Elson Areas, PA-C  triamcinolone ointment (KENALOG) 0.1 % Apply 1 application topically 2 (two) times daily.    [provider]    Family History Family History  Problem Relation Age of Onset  . Stroke Father   . Hypertension Father   . Heart disease Father   . Liver disease Father   . Cancer Mother        breast  . Asthma Sister     Social History Social History   Tobacco Use  . Smoking status: Former Smoker    Last attempt to quit: 11/24/2006    Years since quitting: 11.1  Substance Use Topics  . Alcohol use: Yes    Comment: occasionally  . Drug use: No     Allergies   Asa [aspirin]; Latex; Nsaids; and Peanuts [peanut oil]   Review of Systems Review of Systems  Constitutional: Negative for activity change, appetite change, chills, fatigue and fever.  HENT: Positive for ear pain. Negative for congestion, ear discharge, rhinorrhea, sinus pressure, sore throat and trouble swallowing.   Eyes: Negative  for discharge and redness.  Respiratory: Negative for cough, chest tightness and shortness of breath.   Cardiovascular: Negative for chest pain.  Gastrointestinal: Negative for abdominal pain, diarrhea, nausea and vomiting.  Musculoskeletal: Negative for myalgias.  Skin: Negative for rash.  Neurological: Negative for dizziness, light-headedness and headaches.     Physical Exam Triage Vital Signs ED Triage Vitals  Enc Vitals Group     BP 01/21/18 1721 139/86     Pulse Rate 01/21/18 1721 79     Resp --      Temp 01/21/18 1721 98.9 F (37.2 C)     Temp Source 01/21/18 1721 Oral     SpO2 01/21/18 1721 99 %     Weight --      Height --      Head Circumference --      Peak Flow --      Pain Score 01/21/18 1719 0     Pain Loc --      Pain Edu? --      Excl. in GC? --    No data found.  Updated Vital Signs BP 139/86 (BP Location: Right Arm)   Pulse 79   Temp 98.9 F (37.2 C) (Oral)   LMP 01/06/2018 (Exact Date)    SpO2 99%   Visual Acuity Right Eye Distance:   Left Eye Distance:   Bilateral Distance:    Right Eye Near:   Left Eye Near:    Bilateral Near:     Physical Exam  Constitutional: She is oriented to person, place, and time. She appears well-developed and well-nourished.  No acute distress  HENT:  Head: Normocephalic and atraumatic.  Nose: Nose normal.  Bilateral ears without tenderness to palpation of external auricle, tragus and mastoid, EAC's without erythema or swelling, TM's with good bony landmarks and cone of light. Non erythematous.   Eyes: Conjunctivae are normal.  Neck: Neck supple.  Cardiovascular: Normal rate.  Pulmonary/Chest: Effort normal. No respiratory distress.  Abdominal: She exhibits no distension.  Musculoskeletal: Normal range of motion.  Neurological: She is alert and oriented to person, place, and time.  Skin: Skin is warm and dry.  Psychiatric: She has a normal mood and affect.  Nursing note and vitals reviewed.    UC Treatments / Results  Labs (all labs ordered are listed, but only abnormal results are displayed) Labs Reviewed - No data to display  EKG None  Radiology No results found.  Procedures Procedures (including critical care time)  Medications Ordered in UC Medications - No data to display  Initial Impression / Assessment and Plan / UC Course  I have reviewed the triage vital signs and the nursing notes.  Pertinent labs & imaging results that were available during my care of the patient were reviewed by me and considered in my medical decision making (see chart for details).     No foreign body visualized in EAC, no irritation or other cause of discomfort visualized in the ear.  Continue to monitor discomfort and follow-up if changing, worsening, developing swelling or pain.Discussed strict return precautions. Patient verbalized understanding and is agreeable with plan.  Final Clinical Impressions(s) / UC Diagnoses   Final  diagnoses:  Foreign body in auditory canal, initial encounter     Discharge Instructions     No foreign body Continue to monitor discomfort and follow up if not getting better or worsening   ED Prescriptions    None     Controlled Substance Prescriptions [place] Controlled Substance  Registry consulted? Not Applicable   Lew Dawes, [place] 01/21/18 1835

## 2018-01-21 NOTE — Discharge Instructions (Signed)
No foreign body Continue to monitor discomfort and follow up if not getting better or worsening

## 2018-01-21 NOTE — ED Triage Notes (Signed)
Pt woke up suddenly this morning with a scratching in her ear.  She thinks it may the ball to her earring.

## 2020-04-12 ENCOUNTER — Ambulatory Visit: Payer: Self-pay | Attending: Internal Medicine

## 2020-04-12 DIAGNOSIS — Z23 Encounter for immunization: Secondary | ICD-10-CM

## 2020-04-12 NOTE — Progress Notes (Signed)
   Covid-19 Vaccination Clinic  Name:  Lakashia Collison    MRN: [id number] DOB: [birth year]  04/12/2020  Ms. Iwai was observed post Covid-19 immunization for 30 minutes based on pre-vaccination screening without incident. She was provided with Vaccine Information Sheet and instruction to access the V-Safe system.   Ms. Perillo was instructed to call 911 with any severe reactions post vaccine: Marland Kitchen Difficulty breathing  . Swelling of face and throat  . A fast heartbeat  . A bad rash all over body  . Dizziness and weakness   Immunizations Administered    Name Date Dose VIS Date Route   Pfizer COVID-19 Vaccine 04/12/2020  3:26 PM 0.3 mL 01/24/2020 Intramuscular   Manufacturer: [employer], [employer]   Lot: [id number]   NDC: [address]-6

## 2022-01-15 LAB — OB RESULTS CONSOLE GC/CHLAMYDIA
Chlamydia: NEGATIVE
Neisseria Gonorrhea: NEGATIVE

## 2022-02-12 LAB — OB RESULTS CONSOLE ABO/RH: RH Type: POSITIVE

## 2022-02-12 LAB — OB RESULTS CONSOLE RPR: RPR: NONREACTIVE

## 2022-02-12 LAB — OB RESULTS CONSOLE RUBELLA ANTIBODY, IGM: Rubella: IMMUNE

## 2022-02-12 LAB — HEPATITIS C ANTIBODY: HCV Ab: NEGATIVE

## 2022-02-12 LAB — OB RESULTS CONSOLE HIV ANTIBODY (ROUTINE TESTING): HIV: NONREACTIVE

## 2022-02-12 LAB — OB RESULTS CONSOLE HEPATITIS B SURFACE ANTIGEN: Hepatitis B Surface Ag: NEGATIVE

## 2022-02-12 LAB — OB RESULTS CONSOLE ANTIBODY SCREEN: Antibody Screen: NEGATIVE

## 2022-04-01 ENCOUNTER — Inpatient Hospital Stay (HOSPITAL_COMMUNITY)
Admission: AD | Admit: 2022-04-01 | Discharge: 2022-04-01 | Disposition: A | Discharge status: Home / Self Care | Payer: BC Managed Care – PPO | Attending: Obstetrics and Gynecology | Admitting: Obstetrics and Gynecology

## 2022-04-01 ENCOUNTER — Other Ambulatory Visit: Payer: Self-pay

## 2022-04-01 ENCOUNTER — Encounter (HOSPITAL_COMMUNITY): Payer: Self-pay | Admitting: *Deleted

## 2022-04-01 DIAGNOSIS — O26892 Other specified pregnancy related conditions, second trimester: Secondary | ICD-10-CM | POA: Diagnosis present

## 2022-04-01 DIAGNOSIS — O219 Vomiting of pregnancy, unspecified: Secondary | ICD-10-CM | POA: Insufficient documentation

## 2022-04-01 DIAGNOSIS — O09512 Supervision of elderly primigravida, second trimester: Secondary | ICD-10-CM | POA: Diagnosis not present

## 2022-04-01 DIAGNOSIS — R55 Syncope and collapse: Secondary | ICD-10-CM | POA: Insufficient documentation

## 2022-04-01 DIAGNOSIS — Z87891 Personal history of nicotine dependence: Secondary | ICD-10-CM | POA: Diagnosis not present

## 2022-04-01 DIAGNOSIS — Z3A18 18 weeks gestation of pregnancy: Secondary | ICD-10-CM | POA: Diagnosis not present

## 2022-04-01 DIAGNOSIS — Z3492 Encounter for supervision of normal pregnancy, unspecified, second trimester: Secondary | ICD-10-CM

## 2022-04-01 LAB — BASIC METABOLIC PANEL
Anion gap: 4 — ABNORMAL LOW (ref 5–15)
BUN: 8 mg/dL (ref 6–20)
CO2: 25 mmol/L (ref 22–32)
Calcium: 9.5 mg/dL (ref 8.9–10.3)
Chloride: 106 mmol/L (ref 98–111)
Creatinine, Ser: 0.67 mg/dL (ref 0.44–1.00)
GFR, Estimated: >60 mL/min (ref >60)
Glucose, Bld: 95 mg/dL (ref 70–99)
Potassium: 4.2 mmol/L (ref 3.5–5.1)
Sodium: 135 mmol/L (ref 135–145)

## 2022-04-01 LAB — URINALYSIS, ROUTINE W REFLEX MICROSCOPIC
Bacteria, UA: NONE SEEN
Bilirubin Urine: NEGATIVE
Glucose, UA: NEGATIVE mg/dL
Hgb urine dipstick: NEGATIVE
Ketones, ur: NEGATIVE mg/dL
Leukocytes,Ua: NEGATIVE
Nitrite: NEGATIVE
Protein, ur: 30 mg/dL — AB
Specific Gravity, Urine: 1.021 (ref 1.005–1.030)
pH: 6 (ref 5.0–8.0)

## 2022-04-01 LAB — CBC
HCT: 37.7 % (ref 36.0–46.0)
Hemoglobin: 12.4 g/dL (ref 12.0–15.0)
MCH: 30.2 pg (ref 26.0–34.0)
MCHC: 32.9 g/dL (ref 30.0–36.0)
MCV: 91.7 fL (ref 80.0–100.0)
Platelets: 280 10*3/uL (ref 150–400)
RBC: 4.11 MIL/uL (ref 3.87–5.11)
RDW: 13 % (ref 11.5–15.5)
WBC: 11.4 10*3/uL — ABNORMAL HIGH (ref 4.0–10.5)
nRBC: 0 % (ref 0.0–0.2)

## 2022-04-01 MED ORDER — ONDANSETRON HCL 8 MG PO TABS: 8.0000 mg | ORAL_TABLET | Freq: Three times a day (TID) | ORAL | 0 refills | Status: AC | PRN

## 2022-04-01 NOTE — MAU Provider Note (Signed)
History     CSN: [id number]  Arrival date and time: 04/01/22 1254   Event Date/Time   First Provider Initiated Contact with Patient 04/01/22 1510      Chief Complaint  Patient presents with   Syncope   HPI Rachel Brown is a 41 y.o. G1P0 at [redacted]w[redacted]d who presents to MAU via EMS with chief complaint of near-syncope. Patient endorses history of mild dizziness in her pregnancy. She states she was instructed to sit with her head between her knees until the dizziness resolves. She tried that and experienced a subsequent episode of vomiting and urinary incontinence. She states these are new problems, she has not previously experienced vomiting or incontinence at any time in her pregnancy.  On initial CNM assessment patient reports that she feels better since vomiting. She reports that she is hungry and "really just came to check on baby". She declines offer of exam room and requests discharge home.   Patient receives care with [facility].  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    No past surgical history on file.  Family History  Problem Relation Age of Onset   Stroke Father    Hypertension Father    Heart disease Father    Liver disease Father    Cancer Mother        breast   Asthma Sister     Social History   Tobacco Use   Smoking status: Former    Types: Cigarettes    Quit date: 11/24/2006    Years since quitting: 15.3  Substance Use Topics   Alcohol use: Yes    Comment: occasionally   Drug use: No    Allergies:  Allergies  Allergen Reactions   Asa [Aspirin] Nausea Only   Latex Itching and Swelling   Nsaids Nausea Only   Peanuts [Peanut Oil] Itching  and Swelling    Almonds, cashews, pistachios    Medications Prior to Admission  Medication Sig Dispense Refill Last Dose   etonogestrel-ethinyl estradiol (NUVARING) 0.12-0.015 MG/24HR vaginal ring Place 1 each vaginally every 28 (twenty-eight) days. Insert vaginally and leave in place for 3 consecutive weeks, then remove for 1 week.      HYDROcodone-acetaminophen (NORCO/VICODIN) 5-325 MG per tablet Take 2 tablets by mouth every 4 (four) hours as needed. 10 tablet 0    metroNIDAZOLE (FLAGYL) 500 MG tablet Take 1 tablet (500 mg total) by mouth 2 (two) times daily. 14 tablet 0    terbinafine (LAMISIL) 250 MG tablet Take 1 tablet (250 mg total) by mouth daily. 30 tablet 1    triamcinolone ointment (KENALOG) 0.1 % Apply 1 application topically 2 (two) times daily.       Review of Systems  Gastrointestinal:  Positive for vomiting.  Neurological:  Positive for dizziness.  All other systems reviewed and are negative.  Physical Exam   Blood  pressure 114/68, pulse 82, temperature 98.2 F (36.8 C), temperature source Oral, resp. rate 18, height 5\' 3"  (1.6 m), weight 96 kg, SpO2 100 %.  Physical Exam Vitals and nursing note reviewed. Exam conducted with a chaperone present.  Constitutional:      General: She is not in acute distress.    Appearance: Normal appearance. She is not ill-appearing.  Cardiovascular:     Rate and Rhythm: Normal rate.  Pulmonary:     Effort: Pulmonary effort is normal.  Abdominal:     General: Abdomen is flat.  Skin:    Capillary Refill: Capillary refill takes less than 2 seconds.  Neurological:     Mental Status: She is alert and oriented to person, place, and time.  Psychiatric:        Mood and Affect: Mood normal.        Behavior: Behavior normal.        Thought Content: Thought content normal.        Judgment: Judgment normal.     MAU Course  Procedures  MDM  Patient Vitals for the past 24 hrs:  BP Temp Temp src Pulse Resp SpO2 Height Weight   04/01/22 1403 114/68 98.2 F (36.8 C) Oral 82 18 100 % -- --  04/01/22 1357 -- -- -- -- -- -- 5\' 3"  (1.6 m) 96 kg     Patient Vitals for the past 24 hrs:  BP Temp Temp src Pulse Resp SpO2 Height Weight  04/01/22 1403 114/68 98.2 F (36.8 C) Oral 82 18 100 % -- --  04/01/22 1357 -- -- -- -- -- -- 5\' 3"  (1.6 m) 96 kg   Results for orders placed or performed during the hospital encounter of 04/01/22 (from the past 24 hour(s))  Urinalysis, Routine w reflex microscopic Urine, Clean Catch     Status: Abnormal   Collection Time: 04/01/22  2:57 PM  Result Value Ref Range   Color, Urine YELLOW YELLOW   APPearance HAZY (A) CLEAR   Specific Gravity, Urine 1.021 1.005 - 1.030   pH 6.0 5.0 - 8.0   Glucose, UA NEGATIVE NEGATIVE mg/dL   Hgb urine dipstick NEGATIVE NEGATIVE   Bilirubin Urine NEGATIVE NEGATIVE   Ketones, ur NEGATIVE NEGATIVE mg/dL   Protein, ur 30 (A) NEGATIVE mg/dL   Nitrite NEGATIVE NEGATIVE   Leukocytes,Ua NEGATIVE NEGATIVE   RBC / HPF 0-5 0 - 5 RBC/hpf   WBC, UA 0-5 0 - 5 WBC/hpf   Bacteria, UA NONE SEEN NONE SEEN   Squamous Epithelial / LPF 0-5 0 - 5 /HPF   Mucus PRESENT   CBC     Status: Abnormal   Collection Time: 04/01/22  3:06 PM  Result Value Ref Range   WBC 11.4 (H) 4.0 - 10.5 K/uL   RBC 4.11 3.87 - 5.11 MIL/uL   Hemoglobin 12.4 12.0 - 15.0 g/dL   HCT 04/03/22 04/03/22 - 04/03/22 %   MCV 91.7 80.0 - 100.0 fL   MCH 30.2 26.0 - 34.0 pg   MCHC 32.9 30.0 - 36.0 g/dL   RDW 16.1 09.6 - 04.5 %   Platelets 280 150 - 400 K/uL   nRBC 0.0 0.0 - 0.2 %  Basic metabolic panel     Status: Abnormal   Collection Time: 04/01/22  3:06 PM  Result Value Ref Range   Sodium 135 135 - 145 mmol/L   Potassium 4.2 3.5 - 5.1 mmol/L   Chloride 106 98 - 111 mmol/L   CO2 25  22 - 32 mmol/L   Glucose, Bld 95 70 - 99 mg/dL   BUN 8 6 - 20 mg/dL   Creatinine, Ser 6.83 0.44 - 1.00 mg/dL   Calcium 9.5 8.9 - 41.9 mg/dL   GFR, Estimated >62 >22 mL/min   Anion gap 4 (L) 5 - 15   Meds ordered this  encounter  Medications   ondansetron (ZOFRAN) 8 MG tablet    Sig: Take 1 tablet (8 mg total) by mouth every 8 (eight) hours as needed for nausea or vomiting.    Dispense:  20 tablet    Refill:  0    Order Specific Question:   Supervising Provider    Answer:   Jaynie Collins A [[id number]]    Assessment and Plan  --41 y.o. G1P0 at [redacted]w[redacted]d  --EMS arrival for near syncope and vomiting --Well-appearing throughout --Patient declines exam room, requests phone call with results --Continue Pepcid as previously advised --Discharge home in stable condition  F/U: --Patient called at home at 1640. Identity confirmed x 2. All lab results reviewed. Patient denies questions at end of call --Patient's next appointment with [facility] is next week  Calvert Cantor, MSA, MSN, CNM 04/01/2022, 5:30 PM

## 2022-04-01 NOTE — MAU Note (Addendum)
Rachel Brown is a 41 y.o. at Unknown here in MAU via EMS reporting: she had a dizzy spell and passed out.  Reports she doesn't think she fell but was assisted onto the floor.  Reports had 1 episode of vomiting when she passed out.  Denies VB or LOF. LMP: NA Onset of complaint: today Pain score: 1 Vitals:   04/01/22 1403  BP: 114/68  Pulse: 82  Resp: 18  Temp: 98.2 F (36.8 C)  SpO2: 100%     FHT:148 bpm Lab orders placed from triage:   UA

## 2022-04-01 NOTE — Discharge Instructions (Signed)

## 2022-04-06 NOTE — L&D Delivery Note (Incomplete)
Delivery Note Patient progressed along along a normal labor curve to 10/100/+2.  She pushed well for 90 minutes.  There were variable decelerations with some, but not every, contraction.  Fetal hear arte returned to baseline with moderate variability between contractions.  At 10:04 PM a viable female was delivered via Vaginal, Spontaneous (Presentation:   Occiput Anterior).  APGAR: 1, 5, 8; weight 6 lb 4.9 oz (2860 g).   Placenta status: Spontaneous;Pathology, Abnormal--significant calcifications.  Cord: 3 vessels with the following complications: None.  Cord pH: arterial cord gas collected but not resulted (per NICU sample clotted)  At delivery, tone was initially good with normal heart rate, but then baby became floppy with no respiratory effort so cord was cut and clamped at 45 seconds.  Baby was transported to the warmer and code apgar was called.  Please see NICU consultation note for details.  Baby ultimately required intubation in the delivery room for high oxygen requirements on CPAP and was transported to the NICU  Anesthesia: Epidural Episiotomy: None Lacerations: 1st degree Suture Repair: 2.0 vicryl rapide Est. Blood Loss (mL): 150  Mom to postpartum.  Baby to NICU.  [facility] GEFFEL Rachel Brown 08/30/2022, 10:48 PM

## 2022-08-06 LAB — OB RESULTS CONSOLE GBS: GBS: NEGATIVE

## 2022-08-21 ENCOUNTER — Encounter (HOSPITAL_COMMUNITY): Payer: Self-pay | Admitting: *Deleted

## 2022-08-21 ENCOUNTER — Telehealth (HOSPITAL_COMMUNITY): Payer: Self-pay | Admitting: *Deleted

## 2022-08-21 NOTE — Telephone Encounter (Signed)
Preadmission screen  

## 2022-08-26 ENCOUNTER — Encounter (HOSPITAL_COMMUNITY): Payer: Self-pay | Admitting: *Deleted

## 2022-08-26 ENCOUNTER — Telehealth (HOSPITAL_COMMUNITY): Payer: Self-pay | Admitting: *Deleted

## 2022-08-26 NOTE — Telephone Encounter (Signed)
Preadmission screen  

## 2022-08-30 ENCOUNTER — Inpatient Hospital Stay (HOSPITAL_COMMUNITY): Payer: BC Managed Care – PPO

## 2022-08-30 ENCOUNTER — Encounter (HOSPITAL_COMMUNITY): Payer: Self-pay | Admitting: Obstetrics

## 2022-08-30 ENCOUNTER — Other Ambulatory Visit: Payer: Self-pay

## 2022-08-30 ENCOUNTER — Inpatient Hospital Stay (HOSPITAL_COMMUNITY): Payer: BC Managed Care – PPO | Admitting: Anesthesiology

## 2022-08-30 ENCOUNTER — Inpatient Hospital Stay (HOSPITAL_COMMUNITY)
Admission: RE | Admit: 2022-08-30 | Discharge: 2022-09-02 | DRG: 807 | Disposition: A | Discharge status: Home / Self Care | Payer: BC Managed Care – PPO | Attending: Obstetrics | Admitting: Obstetrics

## 2022-08-30 ENCOUNTER — Encounter (HOSPITAL_COMMUNITY): Payer: Self-pay

## 2022-08-30 DIAGNOSIS — O26893 Other specified pregnancy related conditions, third trimester: Secondary | ICD-10-CM | POA: Diagnosis present

## 2022-08-30 DIAGNOSIS — Z87891 Personal history of nicotine dependence: Secondary | ICD-10-CM | POA: Diagnosis not present

## 2022-08-30 DIAGNOSIS — O09513 Supervision of elderly primigravida, third trimester: Principal | ICD-10-CM | POA: Diagnosis present

## 2022-08-30 DIAGNOSIS — O134 Gestational [pregnancy-induced] hypertension without significant proteinuria, complicating childbirth: Secondary | ICD-10-CM | POA: Diagnosis present

## 2022-08-30 DIAGNOSIS — Z3A40 40 weeks gestation of pregnancy: Secondary | ICD-10-CM | POA: Diagnosis not present

## 2022-08-30 LAB — CBC
HCT: 35.1 % — ABNORMAL LOW (ref 36.0–46.0)
HCT: 35.7 % — ABNORMAL LOW (ref 36.0–46.0)
Hemoglobin: 12.1 g/dL (ref 12.0–15.0)
Hemoglobin: 12.2 g/dL (ref 12.0–15.0)
MCH: 30.7 pg (ref 26.0–34.0)
MCH: 30.8 pg (ref 26.0–34.0)
MCHC: 34.5 g/dL (ref 30.0–36.0)
MCHC: 34.2 g/dL (ref 30.0–36.0)
MCV: 89.1 fL (ref 80.0–100.0)
MCV: 90.2 fL (ref 80.0–100.0)
Platelets: 242 10*3/uL (ref 150–400)
Platelets: 248 10*3/uL (ref 150–400)
RBC: 3.94 MIL/uL (ref 3.87–5.11)
RBC: 3.96 MIL/uL (ref 3.87–5.11)
RDW: 13.8 % (ref 11.5–15.5)
RDW: 13.9 % (ref 11.5–15.5)
WBC: 12.8 10*3/uL — ABNORMAL HIGH (ref 4.0–10.5)
WBC: 11.6 10*3/uL — ABNORMAL HIGH (ref 4.0–10.5)
nRBC: 0.2 % (ref 0.0–0.2)
nRBC: 0 % (ref 0.0–0.2)

## 2022-08-30 LAB — COMPREHENSIVE METABOLIC PANEL
ALT: 21 U/L (ref 0–44)
AST: 31 U/L (ref 15–41)
Albumin: 2.5 g/dL — ABNORMAL LOW (ref 3.5–5.0)
Alkaline Phosphatase: 268 U/L — ABNORMAL HIGH (ref 38–126)
Anion gap: 9 (ref 5–15)
BUN: 6 mg/dL (ref 6–20)
CO2: 18 mmol/L — ABNORMAL LOW (ref 22–32)
Calcium: 8.7 mg/dL — ABNORMAL LOW (ref 8.9–10.3)
Chloride: 108 mmol/L (ref 98–111)
Creatinine, Ser: 0.77 mg/dL (ref 0.44–1.00)
GFR, Estimated: >60 mL/min (ref >60)
Glucose, Bld: 79 mg/dL (ref 70–99)
Potassium: 3.7 mmol/L (ref 3.5–5.1)
Sodium: 135 mmol/L (ref 135–145)
Total Bilirubin: 0.7 mg/dL (ref 0.3–1.2)
Total Protein: 6.1 g/dL — ABNORMAL LOW (ref 6.5–8.1)

## 2022-08-30 LAB — RPR: RPR Ser Ql: NONREACTIVE

## 2022-08-30 LAB — TYPE AND SCREEN
ABO/RH(D): O POS
Antibody Screen: NEGATIVE

## 2022-08-30 MED ORDER — LACTATED RINGERS IV SOLN
500.0000 mL | Freq: Once | INTRAVENOUS | Status: AC
  Administered 2022-08-30: 500 mL via INTRAVENOUS

## 2022-08-30 MED ORDER — LACTATED RINGERS IV SOLN: INTRAVENOUS | Status: DC

## 2022-08-30 MED ORDER — DIPHENHYDRAMINE HCL 50 MG/ML IJ SOLN: 12.5000 mg | INTRAMUSCULAR | Status: DC | PRN

## 2022-08-30 MED ORDER — OXYCODONE-ACETAMINOPHEN 5-325 MG PO TABS: 1.0000 | ORAL_TABLET | ORAL | Status: DC | PRN

## 2022-08-30 MED ORDER — OXYTOCIN-SODIUM CHLORIDE 30-0.9 UT/500ML-% IV SOLN
2.5000 [IU]/h | INTRAVENOUS | Status: DC
  Administered 2022-08-30: 2.5 [IU]/h via INTRAVENOUS

## 2022-08-30 MED ORDER — OXYCODONE-ACETAMINOPHEN 5-325 MG PO TABS: 2.0000 | ORAL_TABLET | ORAL | Status: DC | PRN

## 2022-08-30 MED ORDER — PHENYLEPHRINE 80 MCG/ML (10ML) SYRINGE FOR IV PUSH (FOR BLOOD PRESSURE SUPPORT): 80.0000 ug | PREFILLED_SYRINGE | INTRAVENOUS | Status: DC | PRN

## 2022-08-30 MED ORDER — LIDOCAINE-EPINEPHRINE (PF) 1.5 %-1:200000 IJ SOLN
INTRAMUSCULAR | Status: DC | PRN
  Administered 2022-08-30: 5 mL via EPIDURAL

## 2022-08-30 MED ORDER — NIFEDIPINE ER OSMOTIC RELEASE 30 MG PO TB24
30.0000 mg | ORAL_TABLET | Freq: Every day | ORAL | Status: DC
  Administered 2022-08-30 – 2022-08-31 (×2): 30 mg via ORAL
  Filled 2022-08-30 (×2): qty 1

## 2022-08-30 MED ORDER — FENTANYL-BUPIVACAINE-NACL 0.5-0.125-0.9 MG/250ML-% EP SOLN
12.0000 mL/h | EPIDURAL | Status: DC | PRN
  Administered 2022-08-30: 12 mL/h via EPIDURAL
  Filled 2022-08-30 (×2): qty 250

## 2022-08-30 MED ORDER — LIDOCAINE HCL (PF) 1 % IJ SOLN: 30.0000 mL | INTRAMUSCULAR | Status: DC | PRN

## 2022-08-30 MED ORDER — EPHEDRINE 5 MG/ML INJ: 10.0000 mg | INTRAVENOUS | Status: DC | PRN

## 2022-08-30 MED ORDER — OXYTOCIN BOLUS FROM INFUSION
333.0000 mL | Freq: Once | INTRAVENOUS | Status: AC
  Administered 2022-08-30: 333 mL via INTRAVENOUS

## 2022-08-30 MED ORDER — MISOPROSTOL 25 MCG QUARTER TABLET
25.0000 ug | ORAL_TABLET | ORAL | Status: DC | PRN
  Administered 2022-08-30 (×2): 25 ug via VAGINAL
  Filled 2022-08-30 (×2): qty 1

## 2022-08-30 MED ORDER — LACTATED RINGERS IV SOLN: 500.0000 mL | INTRAVENOUS | Status: DC | PRN

## 2022-08-30 MED ORDER — OXYTOCIN-SODIUM CHLORIDE 30-0.9 UT/500ML-% IV SOLN
1.0000 m[IU]/min | INTRAVENOUS | Status: DC
  Administered 2022-08-30: 2 m[IU]/min via INTRAVENOUS
  Filled 2022-08-30: qty 500

## 2022-08-30 MED ORDER — SOD CITRATE-CITRIC ACID 500-334 MG/5ML PO SOLN: 30.0000 mL | ORAL | Status: DC | PRN

## 2022-08-30 MED ORDER — TERBUTALINE SULFATE 1 MG/ML IJ SOLN: 0.2500 mg | Freq: Once | INTRAMUSCULAR | Status: DC | PRN

## 2022-08-30 MED ORDER — ONDANSETRON HCL 4 MG/2ML IJ SOLN
4.0000 mg | Freq: Four times a day (QID) | INTRAMUSCULAR | Status: DC | PRN
  Administered 2022-08-30: 4 mg via INTRAVENOUS
  Filled 2022-08-30: qty 2

## 2022-08-30 MED ORDER — ACETAMINOPHEN 325 MG PO TABS: 650.0000 mg | ORAL_TABLET | ORAL | Status: DC | PRN

## 2022-08-30 MED ORDER — FENTANYL CITRATE (PF) 100 MCG/2ML IJ SOLN
50.0000 ug | INTRAMUSCULAR | Status: DC | PRN
  Administered 2022-08-30 (×3): 100 ug via INTRAVENOUS
  Filled 2022-08-30 (×3): qty 2

## 2022-08-30 NOTE — Progress Notes (Signed)
Comfortable s/p epidural.  Foley balloon out when sitting up for epidural  BP 128/74   Pulse 82   Temp 98 F (36.7 C) (Oral)   Resp 18   Ht 5\' 3"  (1.6 m)   Wt 116.5 kg   SpO2 98%   BMI 45.49 kg/m   Toco: q3-5 minutes EFM: 130s, moderate variability, occ early decels SVE: 5/100/-1, AROM clear scant fluid  A/P: G1 @ [redacted]w[redacted]d with IOL for AMA IOL: continue pitocin, not yet in active labor GBS negative

## 2022-08-30 NOTE — H&P (Signed)
42 y.o. G1P0 @ [redacted]w[redacted]d presents for  induction of labor for advanced maternal age.  Otherwise has good fetal movement and no bleeding.  Pregnancy complicated by: Advanced maternal age: NIPT low risk.  Declined detailed anatomy scan with MFM, normal anatomy in office.  On low dose aspirin.  efw 5lb 1oz (46%) at [redacted]w[redacted]d Pre-pregnancy BMI: 36 Elevated A1c at initial prenatal labs of 6.0.  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   Vaginal Pap smear, abnormal     Past Surgical History:  Procedure Laterality Date   LEEP     WISDOM TOOTH EXTRACTION Bilateral     OB History  Gravida Para Term Preterm AB Living  1            SAB IAB Ectopic Multiple Live Births               # Outcome Date GA Lbr Len/2nd Weight Sex Delivery Anes PTL Lv  1 Current             Social History   Socioeconomic History   Marital status: Significant Other    Spouse name: Not on file   Number of children: Not on file   Years of education: Not on file   Highest education level: Not on file  Occupational History    Employer: [employer]  Tobacco Use   Smoking status: Former    Types: Cigarettes    Quit date: 11/24/2006    Years since quitting: 15.7   Smokeless tobacco: Not on file  Vaping Use   Vaping Use: Never used  Substance and Sexual Activity   Alcohol use: Yes    Comment: occasionally   Drug use: No   Sexual activity: Not Currently    Birth control/protection: Condom    Comment: Nuva Ring  Other Topics Concern   Not on file  Social History Narrative   Not on file   Social Determinants of Health   Financial Resource Strain: Not on file  Food Insecurity: No Food Insecurity (08/30/2022)   Hunger Vital Sign    Worried About Running Out of Food in the Last Year: Never true    Ran Out of Food in the Last Year:  Never true  Transportation Needs: No Transportation Needs (08/30/2022)   PRAPARE - Administrator, Civil Service (Medical): No    Lack of Transportation (Non-Medical): No  Physical Activity: Not on file  Stress: Not on file  Social Connections: Not on file  Intimate Partner Violence: Not At Risk (08/30/2022)   Humiliation, Afraid, Rape, and Kick questionnaire    Fear of Current or Ex-Partner: No    Emotionally Abused: No    Physically Abused: No    Sexually Abused: No   Asa [aspirin], Latex, Nsaids, and Peanuts [peanut oil]    Prenatal Transfer Tool  Maternal Diabetes: No Genetic Screening: Normal Maternal Ultrasounds/Referrals: Normal Fetal Ultrasounds or other Referrals:  None Maternal Substance Abuse:  No Significant Maternal Medications:  Meds include: Other:  aspirin Significant Maternal Lab Results: Group B Strep negative  ABO, Rh: --/--/O POS (05/26 0150) Antibody: NEG (05/26 0150) Rubella: Immune (11/09 0000) RPR: Nonreactive (11/09 0000)  HBsAg: Negative (11/09 0000)  HIV: Non-reactive (11/09 0000)  GBS: Negative/-- (05/02 0000)  General:  NAD Abdomen:  soft, gravid Ex:  1+ edema SVE:  1/90/-3, foley balloon placed and filled with 60 mL fluid FHTs:  120s, moderate variability, category 1 Toco:  irregular  Limited BSUS confirms cephalic presentation  A/P   42 y.o. G1P0 [redacted]w[redacted]d presents with IOL for AMA>40.  Declined IOL at 39 weeks IOL.  S/p misoprostol x 1 dose.  FB+ misoprostol now, will start pitocin in 4 hours New onset elevated BPs.  Physician was not notified overnight.  One severe range with repeat mild range.  Will repeat cbc, add cmp.  Is asymptomatic at this time GBS negative   Rachel Brown GEFFEL Rachel Brown

## 2022-08-30 NOTE — Anesthesia Procedure Notes (Signed)
Epidural Patient location during procedure: OB Start time: 08/30/2022 12:25 PM End time: 08/30/2022 12:31 PM  Staffing Anesthesiologist: Atilano Median, DO Performed: anesthesiologist   Preanesthetic Checklist Completed: patient identified, IV checked, site marked, risks and benefits discussed, surgical consent, monitors and equipment checked, pre-op evaluation and timeout performed  Epidural Patient position: sitting Prep: ChloraPrep Patient monitoring: heart rate, continuous pulse ox and blood pressure Approach: midline Location: L4-L5 Injection technique: LOR saline  Needle:  Needle type: Tuohy  Needle gauge: 17 G Needle length: 9 cm Needle insertion depth: 7 cm Catheter type: closed end flexible Catheter size: 20 Guage Catheter at skin depth: 11 cm Test dose: negative and 1.5% lidocaine with Epi 1:200 K  Assessment Events: blood not aspirated, no cerebrospinal fluid, injection not painful, no injection resistance and no paresthesia  Additional Notes  Patient identified. Risks/Benefits/Options discussed with patient including but not limited to bleeding, infection, nerve damage, paralysis, failed block, incomplete pain control, headache, blood pressure changes, nausea, vomiting, reactions to medications, itching and postpartum back pain. Confirmed with bedside nurse the patient's most recent platelet count. Confirmed with patient that they are not currently taking any anticoagulation, have any bleeding history or any family history of bleeding disorders. Patient expressed understanding and wished to proceed. All questions were answered. Sterile technique was used throughout the entire procedure. Please see nursing notes for vital signs. Test dose was given through epidural catheter and negative prior to continuing to dose epidural or start infusion. Warning signs of high block given to the patient including shortness of breath, tingling/numbness in hands, complete motor  block, or any concerning symptoms with instructions to call for help. Patient was given instructions on fall risk and not to get out of bed. All questions and concerns addressed with instructions to call with any issues or inadequate analgesia.    Reason for block:procedure for pain

## 2022-08-30 NOTE — Anesthesia Preprocedure Evaluation (Addendum)
Anesthesia Evaluation  Patient identified by MRN, date of birth, ID band Patient awake    Reviewed: Allergy & Precautions, NPO status , Patient's Chart, lab work & pertinent test results  Airway Mallampati: II  TM Distance: >3 FB Neck ROM: Full    Dental no notable dental hx.    Pulmonary neg pulmonary ROS, former smoker   Pulmonary exam normal        Cardiovascular negative cardio ROS  Rhythm:Regular Rate:Normal     Neuro/Psych negative neurological ROS  negative psych ROS   GI/Hepatic negative GI ROS, Neg liver ROS,,,  Endo/Other  negative endocrine ROS    Renal/GU negative Renal ROS  negative genitourinary   Musculoskeletal   Abdominal Normal abdominal exam  (+)   Peds  Hematology  (+) Blood dyscrasia, anemia   Anesthesia Other Findings   Reproductive/Obstetrics (+) Pregnancy                             Anesthesia Physical Anesthesia Plan  ASA: 3  Anesthesia Plan: Epidural   Post-op Pain Management:    Induction:   PONV Risk Score and Plan: 2 and Treatment may vary due to age or medical condition  Airway Management Planned: Natural Airway  Additional Equipment: None  Intra-op Plan:   Post-operative Plan:   Informed Consent: I have reviewed the patients History and Physical, chart, labs and discussed the procedure including the risks, benefits and alternatives for the proposed anesthesia with the patient or authorized representative who has indicated his/her understanding and acceptance.     Dental advisory given  Plan Discussed with:   Anesthesia Plan Comments:        Anesthesia Quick Evaluation

## 2022-08-30 NOTE — Progress Notes (Signed)
Feeling some intermittent pressure  BP (!) 150/90   Pulse (!) 106   Temp 97.8 F (36.6 C) (Oral)   Resp 18   Ht 5\' 3"  (1.6 m)   Wt 116.5 kg   SpO2 98%   BMI 45.49 kg/m   Toco: q2-4 minutes EFM: 150s, moderate variability, occ short / sharp variable with contraction SVE: 9.5/100/0  A/P: G1 @ [redacted]w[redacted]d with IOL for AMA Continue pitocin.  WIll recheck in 1 hour or sooner as needed Anticipate SVD

## 2022-08-31 LAB — CBC
HCT: 35.6 % — ABNORMAL LOW (ref 36.0–46.0)
Hemoglobin: 12.1 g/dL (ref 12.0–15.0)
MCH: 30.3 pg (ref 26.0–34.0)
MCHC: 34 g/dL (ref 30.0–36.0)
MCV: 89 fL (ref 80.0–100.0)
Platelets: 249 10*3/uL (ref 150–400)
RBC: 4 MIL/uL (ref 3.87–5.11)
RDW: 13.9 % (ref 11.5–15.5)
WBC: 20.3 10*3/uL — ABNORMAL HIGH (ref 4.0–10.5)
nRBC: 0 % (ref 0.0–0.2)

## 2022-08-31 LAB — CBC WITH DIFFERENTIAL/PLATELET
Abs Immature Granulocytes: 0.14 10*3/uL — ABNORMAL HIGH (ref 0.00–0.07)
Basophils Absolute: 0 10*3/uL (ref 0.0–0.1)
Basophils Relative: 0 %
Eosinophils Absolute: 0 10*3/uL (ref 0.0–0.5)
Eosinophils Relative: 0 %
HCT: 37.3 % (ref 36.0–46.0)
Hemoglobin: 12.6 g/dL (ref 12.0–15.0)
Immature Granulocytes: 1 %
Lymphocytes Relative: 11 %
Lymphs Abs: 1.9 10*3/uL (ref 0.7–4.0)
MCH: 29.9 pg (ref 26.0–34.0)
MCHC: 33.8 g/dL (ref 30.0–36.0)
MCV: 88.6 fL (ref 80.0–100.0)
Monocytes Absolute: 0.9 10*3/uL (ref 0.1–1.0)
Monocytes Relative: 5 %
Neutro Abs: 14.9 10*3/uL — ABNORMAL HIGH (ref 1.7–7.7)
Neutrophils Relative %: 83 %
Platelets: 242 10*3/uL (ref 150–400)
RBC: 4.21 MIL/uL (ref 3.87–5.11)
RDW: 13.6 % (ref 11.5–15.5)
WBC: 17.9 10*3/uL — ABNORMAL HIGH (ref 4.0–10.5)
nRBC: 0.1 % (ref 0.0–0.2)

## 2022-08-31 MED ORDER — DIPHENHYDRAMINE HCL 25 MG PO CAPS: 25.0000 mg | ORAL_CAPSULE | Freq: Four times a day (QID) | ORAL | Status: DC | PRN

## 2022-08-31 MED ORDER — OXYCODONE HCL 5 MG PO TABS: 10.0000 mg | ORAL_TABLET | ORAL | Status: DC | PRN

## 2022-08-31 MED ORDER — ONDANSETRON HCL 4 MG PO TABS: 4.0000 mg | ORAL_TABLET | ORAL | Status: DC | PRN

## 2022-08-31 MED ORDER — PRENATAL MULTIVITAMIN CH
1.0000 | ORAL_TABLET | Freq: Every day | ORAL | Status: DC
  Administered 2022-08-31 – 2022-09-01 (×2): 1 via ORAL
  Filled 2022-08-31 (×2): qty 1

## 2022-08-31 MED ORDER — NIFEDIPINE ER OSMOTIC RELEASE 60 MG PO TB24
60.0000 mg | ORAL_TABLET | Freq: Every day | ORAL | Status: DC
  Administered 2022-09-01 – 2022-09-02 (×2): 60 mg via ORAL
  Filled 2022-08-31 (×2): qty 1

## 2022-08-31 MED ORDER — ACETAMINOPHEN 500 MG PO TABS
1000.0000 mg | ORAL_TABLET | Freq: Four times a day (QID) | ORAL | Status: DC
  Administered 2022-08-31 – 2022-09-02 (×10): 1000 mg via ORAL
  Filled 2022-08-31 (×12): qty 2

## 2022-08-31 MED ORDER — BENZOCAINE-MENTHOL 20-0.5 % EX AERO
1.0000 | INHALATION_SPRAY | CUTANEOUS | Status: DC | PRN
  Administered 2022-08-31: 1 via TOPICAL
  Filled 2022-08-31: qty 56

## 2022-08-31 MED ORDER — TETANUS-DIPHTH-ACELL PERTUSSIS 5-2.5-18.5 LF-MCG/0.5 IM SUSY: 0.5000 mL | PREFILLED_SYRINGE | Freq: Once | INTRAMUSCULAR | Status: DC

## 2022-08-31 MED ORDER — DIBUCAINE (PERIANAL) 1 % EX OINT: 1.0000 | TOPICAL_OINTMENT | CUTANEOUS | Status: DC | PRN

## 2022-08-31 MED ORDER — NIFEDIPINE ER OSMOTIC RELEASE 30 MG PO TB24
30.0000 mg | ORAL_TABLET | Freq: Once | ORAL | Status: AC
  Administered 2022-08-31: 30 mg via ORAL
  Filled 2022-08-31: qty 1

## 2022-08-31 MED ORDER — OXYCODONE HCL 5 MG PO TABS: 5.0000 mg | ORAL_TABLET | ORAL | Status: DC | PRN

## 2022-08-31 MED ORDER — ONDANSETRON HCL 4 MG/2ML IJ SOLN: 4.0000 mg | INTRAMUSCULAR | Status: DC | PRN

## 2022-08-31 MED ORDER — SENNOSIDES-DOCUSATE SODIUM 8.6-50 MG PO TABS
2.0000 | ORAL_TABLET | ORAL | Status: DC
  Administered 2022-09-01: 2 via ORAL
  Filled 2022-08-31 (×3): qty 2

## 2022-08-31 MED ORDER — NIFEDIPINE ER OSMOTIC RELEASE 30 MG PO TB24: 30.0000 mg | ORAL_TABLET | Freq: Every day | ORAL | Status: DC

## 2022-08-31 MED ORDER — SIMETHICONE 80 MG PO CHEW: 80.0000 mg | CHEWABLE_TABLET | ORAL | Status: DC | PRN

## 2022-08-31 MED ORDER — WITCH HAZEL-GLYCERIN EX PADS: 1.0000 | MEDICATED_PAD | CUTANEOUS | Status: DC | PRN

## 2022-08-31 MED ORDER — COCONUT OIL OIL: 1.0000 | TOPICAL_OIL | Status: DC | PRN

## 2022-08-31 NOTE — Progress Notes (Signed)
Post Partum Day 1 Subjective: no complaints, up ad lib, voiding, tolerating PO, + flatus, and lochia mild. She denies HA, blurry vision, CP, SOB or RUQ pain. She is sitting in bed, eating. She reports baby stable; possibly getting extubated today. Wants to breastfeed/pump  Objective: Blood pressure (!) 150/84, pulse 64, temperature 98.4 F (36.9 C), temperature source Oral, resp. rate 18, height 5\' 3"  (1.6 m), weight 116.5 kg, SpO2 99 %, unknown if currently breastfeeding.  Physical Exam:  General: alert, cooperative, and no distress Lochia: appropriate Uterine Fundus: deferred but firm per nursing  Incision: n/a DVT Evaluation: No evidence of DVT seen on physical exam.  Recent Labs    08/31/22 0011 08/31/22 0506  HGB 12.6 12.1  HCT 37.3 35.6*    Assessment/Plan: Plan for discharge tomorrow, Breastfeeding, and Lactation consult Monitor BP- trending down since received procardia 30xl in past . Adjust dose as indicated  Lactation consult pending    LOS: 1 day   Ayce Pietrzyk W Kris Burd, DO 08/31/2022, 11:16 AM

## 2022-08-31 NOTE — Anesthesia Postprocedure Evaluation (Signed)
Anesthesia Post Note  Patient: Rachel Brown  Procedure(s) Performed: AN AD HOC LABOR EPIDURAL     Patient location during evaluation: Mother Baby Anesthesia Type: Epidural Level of consciousness: awake and alert Pain management: pain level controlled Vital Signs Assessment: post-procedure vital signs reviewed and stable Respiratory status: spontaneous breathing, nonlabored ventilation and respiratory function stable Cardiovascular status: stable Postop Assessment: no headache, no backache and epidural receding Anesthetic complications: no   No notable events documented.  Last Vitals:  Vitals:   08/31/22 0427 08/31/22 0759  BP: 138/73 (!) 150/84  Pulse: 100 64  Resp: 17 18  Temp: 36.9 C 36.9 C  SpO2: 99% 99%    Last Pain:  Vitals:   08/31/22 0759  TempSrc: Oral  PainSc:    Pain Goal: Patients Stated Pain Goal: 2 (08/31/22 0036)                 Marrion Coy

## 2022-08-31 NOTE — Lactation Note (Signed)
This note was copied from a baby's chart.  NICU Lactation Consultation Note  Patient Name: Rachel Brown [id number] Date: 08/31/2022 Age:40 hours  Reason for consult: Initial assessment; Primapara; 1st time breastfeeding; NICU baby; Other (Comment); Term (AMA, gHTN)  SUBJECTIVE Visited with family of 19 hours old FT NICU female; Rachel Brown is a P1 and reports that her plan is to do direct breastfeeding along with pumping and bottle feeding. This LC assisted with hand expression and set up a DEBP at 13 hours post-partum, she started pumping during [facility] consultation, praised her for her efforts. She started complaining of cramping, offered to call her RN and explained that pumping/latching will trigger cramping due to the release of oxytocin. Reviewed pumping schedule, pump settings, lactogenesis II and anticipatory guidelines.  OBJECTIVE Infant data: Mother's Current Feeding Choice: -- (NPO)  Maternal data: G1P1001  Vaginal, Spontaneous Has patient been taught Hand Expression?: Yes Hand Expression Comments: no colostrum noted yet Significant Breast History:: (++) breast changes during the pregnancy Current breast feeding challenges:: NICU admission Does the patient have breastfeeding experience prior to this delivery?: No How long did the patient breastfeed?: N/A Pumping frequency: initiated pumping at 13 hours post-partum Pumped volume: 0 mL Flange Size: 24 Risk factor for low milk supply:: primipara, infant separation, AMA  Pump: Personal (Zoomee DEBP from insurance)  ASSESSMENT Infant: In NICU  Maternal: Milk volume: Normal  INTERVENTIONS/PLAN Interventions: Interventions: Breast feeding basics reviewed; Breast massage; Hand express; Coconut oil; DEBP; Education; [employer] brochure Tools: Pump; Flanges; Coconut oil Pump Education: Setup, frequency, and cleaning; Milk Storage  Plan: Encouraged pumping every 3 hours, ideally 8 pumping sessions/24 hours Breast massage,  hand expression and coconut oil were also encouraged prior pumping  FOB present and supportive. All questions and concerns answered, family to contact [facility] services PRN.  Consult Status: NICU follow-up NICU Follow-up type: New admission follow up   Rachel Brown 08/31/2022, 11:53 AM

## 2022-08-31 NOTE — Progress Notes (Signed)
This nurse asked patient upon admission whether or not she would like to use the breast milk/ donor milk/ formula or what her feeding preference would be for baby. Patient stated she would like to try to latch with baby but she doesn't know if she will be able to since baby is in NICU, this nurse offered using breast pump to help stimulate milk production and patient was agreeable. However, patient stated "I am not interested in learning how to use the pump right now, right now all I want to do is eat and then go to sleep. I've done had a rough day". This nurse will respect patient's wishes at this time.

## 2022-08-31 NOTE — Plan of Care (Signed)

## 2022-09-01 ENCOUNTER — Encounter (HOSPITAL_COMMUNITY): Payer: Self-pay | Admitting: Pediatrics

## 2022-09-01 NOTE — Clinical Social Work Maternal (Signed)
CLINICAL SOCIAL WORK MATERNAL/CHILD NOTE  Patient Details  Name: Rachel Brown MRN: [id number] Date of Birth: January 29, 1981  Date:  09/01/2022  Clinical Social Worker Initiating Note:  Rachel Brown Date/Time: Initiated:  09/01/22/1157     Child's Name:  Rachel Brown   Biological Parents:  Mother, Father (FOB is Rachel Brown 40/21/84 [phone])   Need for Interpreter:  None   Reason for Referral:  Behavioral Health Concerns (Hx of depression)   Address:  [address] [place] [address]    Phone number:  [phone] (home) [phone] (work)    Additional phone number: FOB's number is [phone]  Household Members/Support Persons (HM/SP):       HM/SP Name Relationship DOB or Age  HM/SP -1        HM/SP -2        HM/SP -3        HM/SP -4        HM/SP -5        HM/SP -6        HM/SP -7        HM/SP -8          Natural Supports (not living in the home):  Extended Family, Spouse/significant other, Immediate Family (Per the Couple, FOB's family will also provide supports.)   Professional Supports: Therapist (MOB's therapist is [facility] and MOB will be scheduling an appointment post MOB's  discharge.)   Employment: Environmental education officer   Type of Work:     Education:  Counsellor arranged:    Surveyor, quantity Resources:  Media planner    Other Resources:   (CSW provided MOB with information to apply for [employer] and Sales executive.)   Cultural/Religious Considerations Which May Impact Care:  Per [employer] Sheet, MOB is [employer].   Strengths:  Compliance with medical plan  , Home prepared for child  , Understanding of illness, Ability to meet basic needs  , Pediatrician chosen   Psychotropic Medications:         Pediatrician:    [employer] area  Pediatrician List:   [facility]  [place]    [place]    [place] [facility]      Pediatrician Fax Number:    Risk Factors/Current  Problems:  Mental Health Concerns     Cognitive State:  Alert  , Able to Concentrate  , Insightful  , Goal Oriented  , Linear Thinking     Mood/Affect:  Interested  , Comfortable  , Bright  , Happy  , Relaxed  , Tearful  , Calm     CSW Assessment: CSW meet with MOB and FOB at infant's bedside to complete an assessment for mental health concerns and Edinburgh Score.  When CSW arrived, MOB and FOB were observing infant while she was resting  in her isolette; everyone appeared happy and comfortable. FOB also appeared to be a support to MOB and was engaged with CSW. MOB was inviting and polite. MOB also was appropriately tearful has she shared her delivery story and her hearts desire to be a mother.   CSW asked FOB to leave the room to assess MOB's MH hx; FOB left without incident. CSW inquired about MOB's MH and MOB acknowledged a hx of depression.  Per MOB, MOB was dx around age 72.  MOB shared she is not currently taking any medications and her symptoms are currently managed by routine outpatient counseling.  MOB  reported she had to cancel her last appointment due to hospital admission and however she has rescheduled but at this time could not recall date. CSW provided education regarding the baby blues period vs. perinatal mood disorders, discussed treatment and gave resources for mental health follow up if concerns arise.  CSW recommends self-evaluation during the postpartum time period using the New Mom Checklist from Postpartum Progress and encouraged MOB to contact a medical professional if symptoms are noted at any time.  MOB presented with insight and awareness and denied SI, HI, and DV when assessed for safety.  MOB shared having a good support team and communicated feeling comfortable seeking help if needed.   The couple reported feeling well informed by NICU team and they denied having any questions or concerns.    CSW reviewed safe sleep and SIDS. MOB and FOB were knowledgeable and asked  appropriate questions.  The couple communicated that they have everything they need for the baby and is prepared to meet infant's needs post discharge.    CSW will continue to offer resources and supports to family while infant remains in NICU.    CSW Plan/Description:  Psychosocial Support and Ongoing Assessment of Needs, Sudden Infant Death Syndrome (SIDS) Education, Perinatal Mood and Anxiety Disorder (PMADs) Education, Other Patient/Family Education, Other Information/Referral to [employer]   CSW will continue to offer resources and supports to family while infant remains in NICU.   Rachel Brown, MSW, LCSW Clinical Social Work [phone]   Rachel Cower, LCSW 09/01/2022, 12:06 PM

## 2022-09-01 NOTE — Progress Notes (Signed)
This AM BP 142/84, 138/86, 152/86  Most recent BP taken after some issues with birth Passenger transport manager. Patient states she hasn't slept well either. Talked about the stressors of baby in NICU, support system, pressure to provide breast milk, etc. Commended on efforts thus far, advised that breastfeeding/pumping can take time to figure out. On her way back to see baby now. Denies RUQ, PC, SOB, visual changes, worsening LE edema. Mild headache improved on PO meds. Continue to monitor through the evening, pending repeat BP may warrant overnight watch for BP. Patient understands

## 2022-09-01 NOTE — Lactation Note (Signed)
This note was copied from a baby's chart.  NICU Lactation Consultation Note  Patient Name: Rachel Brown [id number] Date: 09/01/2022 Age:40 hours  Reason for consult: Follow-up assessment; Primapara; 1st time breastfeeding; Breastfeeding assistance; Other (Comment); Term; NICU baby (AMA, gHTN)  SUBJECTIVE Visited with family of 71 hours old FT NICU female, Rachel Brown is a P1 and reports she's pumping but not consistently. She's still feeling the abdominal cramping but not as intense as yesterday. Explained the importance of consistent pumping for the onset of lactogenesis II and to protect her supply. This LC assisted with the 3 pm feeding for the first latch per RN request, and took baby "Rachel Brown" to the L side in cross cradle hold using her Boppy pillow but she was unable to sustain the latch (see LATCH score). Once NS # 20 was in place baby stayed latch for the remaining of this 20 minutes feeding. Parents aware that any amount/droplets she might get at the breast is just "extra", baby will continue her IV fluids and later on PO with a bottle until maternal supply comes in, Rachel Brown voiced she would like to keep baby in an exclusive breastmilk diet if possible. Reviewed pumping schedule, lactogenesis II, IDF 1/2 and anticipatory guidelines.   OBJECTIVE Infant data: Mother's Current Feeding Choice: Breast Milk and Donor Milk  Infant feeding assessment Scale for Readiness: 4   Maternal data: G1P1001  Vaginal, Spontaneous Has patient been taught Hand Expression?: Yes Hand Expression Comments: no colostrum noted yet Significant Breast History:: (++) breast changes during the pregnancy Current breast feeding challenges:: NICU admission Does the patient have breastfeeding experience prior to this delivery?: No How long did the patient breastfeed?: N/A Pumping frequency: 3-4 times/24 hours Pumped volume: 0 mL (droplets) Flange Size: 24 Risk factor for low milk supply:: primipara, infant  separation, AMA  Pump: Personal (Zoomee DEBP from insurance)  ASSESSMENT Infant: LATCH Documentation Latch: 1 (with NS # 20, baby kept slipping off the breast without it) Audible Swallowing: 0 (no colostrum noted on NS # 20 at the end of the feeding, noticed jaw extensions but maternal supply is not in yet) Type of Nipple: 2 Comfort (Breast/Nipple): 2 Hold (Positioning): 1 LATCH Score: 6  Feeding Status: Ad lib  Maternal: Milk volume: Normal  INTERVENTIONS/PLAN Interventions: Interventions: Breast feeding basics reviewed; Assisted with latch; Skin to skin; Breast compression; Adjust position; Support pillows; Coconut oil; Education; Infant Driven Feeding Algorithm education Tools: Pump; Flanges; Coconut oil Pump Education: Setup, frequency, and cleaning; Milk Storage  Plan: Encouraged pumping every 3 hours, ideally 8 pumping sessions/24 hours Breast massage, hand expression and coconut oil were also encouraged prior pumping She'll continue taking baby to breast on feeding cues around feeding times using NS # 20 PRN and will call for assistance if needed   Rachel Brown present and supportive. All questions and concerns answered, family to contact [facility] services PRN.  Consult Status: NICU follow-up NICU Follow-up type: Maternal D/C visit; Assist with IDF-2 (Mother does not need to pre-pump before breastfeeding)   Debera Lat 09/01/2022, 3:43 PM

## 2022-09-01 NOTE — Progress Notes (Signed)
Chaplain attempted to introduce spiritual care and offer support in the setting of NICU admission. Chaplain was notified by on-call weekend chaplain of Neonatal Code blue and NICU admission of baby Rachel Brown. Chaplain will attempt to locate parents at pt or infant's bedside on 5/29.   Please page as further needs arise.  Maryanna Shape. Carley Hammed, M.Div. [facility] Chaplain Pager [phone] Office [phone]

## 2022-09-01 NOTE — Progress Notes (Signed)
Last BP WNL BP 137/87 (BP Location: Left Arm)   Pulse 95   Temp 98.1 F (36.7 C) (Oral)   Resp 19   Ht 5\' 3"  (1.6 m)   Wt 116.5 kg   SpO2 100%   Breastfeeding Unknown   BMI 45.49 kg/m  Would want further BP data to trend, will allow one more night to establish good control, anticipate DC in AM. Patient with NICU baby now

## 2022-09-01 NOTE — Progress Notes (Signed)
Post Partum Day 2 Subjective: no complaints, up ad lib, voiding, tolerating PO, + flatus, and lochia mild. She denies HA, blurry vision, CP, SOB or RUQ pain. Baby on low settings, no supplemental O2. S/p AM dose of procardia. Has seen lactation   Objective: Blood pressure (!) 148/86, pulse 89, temperature 98 F (36.7 C), temperature source Oral, resp. rate 18, height 5\' 3"  (1.6 m), weight 116.5 kg, SpO2 100 %, unknown if currently breastfeeding.  Physical Exam:  General: alert, cooperative, and no distress Lochia: appropriate Uterine Fundus: deferred but firm per nursing  Incision: n/a DVT Evaluation: No evidence of DVT seen on physical exam.  Recent Labs    08/31/22 0011 08/31/22 0506  HGB 12.6 12.1  HCT 37.3 35.6*     Assessment/Plan: Breastfeeding and Lactation consult Monitor BP- Now on Procardia 60XL QD. Watch BP throughout morning and early afternoon, DC if stable. Patient asymptomatic   LOS: 2 days   Carlisle Cater, MD 09/01/2022, 8:57 AM

## 2022-09-02 LAB — SURGICAL PATHOLOGY

## 2022-09-02 MED ORDER — NIFEDIPINE ER OSMOTIC RELEASE 60 MG PO TB24: 90.0000 mg | ORAL_TABLET | Freq: Every day | ORAL | Status: DC

## 2022-09-02 MED ORDER — NIFEDIPINE ER OSMOTIC RELEASE 30 MG PO TB24
30.0000 mg | ORAL_TABLET | Freq: Once | ORAL | Status: AC
  Administered 2022-09-02: 30 mg via ORAL
  Filled 2022-09-02: qty 1

## 2022-09-02 MED ORDER — NIFEDIPINE ER OSMOTIC RELEASE 90 MG PO TB24: 90.0000 mg | ORAL_TABLET | Freq: Every day | ORAL | 1 refills | Status: AC

## 2022-09-02 NOTE — Progress Notes (Signed)
Patient is doing well.  She is ambulating, voiding, tolerating PO.  Pain control is good.  Lochia is appropriate  BP trending down but still suboptimal control.  Received AM dose of nifedipine Xl 60 mg at 0615 today.  BP taken while rounding (did not record) was 143/71, P 104  Patient strongly desires discharge to home today.  She wants to sleep in her own bed and is worried about her emotional support dog.  She feels that discharge would help BP control  Vitals:   09/02/22 0125 09/02/22 0507 09/02/22 0826 09/02/22 0829  BP: (!) 147/90 (!) 148/90 (!) 147/88 (!) 147/88  Pulse: 94 100 100 100  Resp: 16 17 18    Temp: 98.1 F (36.7 C) 98.2 F (36.8 C)    TempSrc: Oral Oral    SpO2: 100% 99%    Weight:      Height:        NAD Fundus firm Ext: 1+ , symmetric  Lab Results  Component Value Date   WBC 20.3 (H) 08/31/2022   HGB 12.1 08/31/2022   HCT 35.6 (L) 08/31/2022   MCV 89.0 08/31/2022   PLT 249 08/31/2022    --/--/O POS (05/26 0150)  A/P 42 y.o. G1P1001 PPD#3  s/p TSVD c/b GHTN GHTN:  Reviewed goal BP management would ideally be < 140/90s.  Has trended down from 150s systolic during labor.  She is asymptomatic.  Will give an additional 30mg  procardia now and reassess. Given patient's strong desire for discharge, could consider close f/u in office for BP check in 2 days if BP remains stable  Evony Rezek GEFFEL Macsen Nuttall

## 2022-09-02 NOTE — Progress Notes (Signed)
Patient returning from NICU, PP discharge instructions reviewed, Pre-e s/s reviewed and patient able to verbalize symptoms of Pre-E to have evaluated. Patient ambulating.

## 2022-09-02 NOTE — Plan of Care (Signed)
Patient being discharged home with Pre-E S/S reviewed.

## 2022-09-02 NOTE — Discharge Summary (Signed)
Postpartum Discharge Summary  Date of Service updated 09/02/22       Patient Name: Rachel Brown DOB: 06/18/1980 MRN: [id number]  Date of admission: 08/30/2022 Delivery date:08/30/2022  Delivering provider: Marlow Baars  Date of discharge: 09/02/2022  Admitting diagnosis: Advanced maternal age, 1st pregnancy, third trimester [O09.513] Intrauterine pregnancy: [redacted]w[redacted]d     Secondary diagnosis:  Principal Problem:   Advanced maternal age, 1st pregnancy, third trimester  Additional problems: gestational hypertension    Discharge diagnosis: Term Pregnancy Delivered and Gestational Hypertension                                              Post partum procedures: n/a Augmentation: AROM, Pitocin, Cytotec, and IP Foley Complications: None  Hospital course: Induction of Labor With Vaginal Delivery   42 y.o. yo G1P1001 at [redacted]w[redacted]d was admitted to the hospital 08/30/2022 for induction of labor.  Indication for induction: AMA.  Patient had an labor course complicated by new onset gestational hypertension during labor Membrane Rupture Time/Date: 4:10 PM ,08/30/2022   Delivery Method:Vaginal, Spontaneous  Episiotomy: None  Lacerations:  1st degree  Details of delivery can be found in separate delivery note.  Patient had a postpartum course complicated by [facility].  She was started on nifedipine Xl immediately following delivery.  It was titrated to 90mg  daily on the day of discharge.  BPs had slowly trended down.  On PPD#3, she desired discharge to home with close follow up in the office on 5/31.  She will obtain home BP cuff . Patient is discharged home 09/02/22.  Newborn Data: Birth date:08/30/2022  Birth time:10:04 PM  Gender:Female  Living status:Living  Apgars:1 ,5  Weight:2860 g   Magnesium Sulfate received: No   Physical exam  Vitals:   09/02/22 0826 09/02/22 0829 09/02/22 1317 09/02/22 1328  BP: (!) 147/88 (!) 147/88 (!) 138/90 (!) 138/90  Pulse: 100 100  96  Resp: 18  19   Temp:       TempSrc:      SpO2:      Weight:      Height:       General: alert, cooperative, and no distress Lochia: appropriate Uterine Fundus: firm Incision: N/A DVT Evaluation: No evidence of DVT seen on physical exam. Labs: Lab Results  Component Value Date   WBC 20.3 (H) 08/31/2022   HGB 12.1 08/31/2022   HCT 35.6 (L) 08/31/2022   MCV 89.0 08/31/2022   PLT 249 08/31/2022      Latest Ref Rng & Units 08/30/2022    7:56 AM  CMP  Glucose 70 - 99 mg/dL 79   BUN 6 - 20 mg/dL 6   Creatinine 4.09 - 8.11 mg/dL 9.14   Sodium 782 - 956 mmol/L 135   Potassium 3.5 - 5.1 mmol/L 3.7   Chloride 98 - 111 mmol/L 108   CO2 22 - 32 mmol/L 18   Calcium 8.9 - 10.3 mg/dL 8.7   Total Protein 6.5 - 8.1 g/dL 6.1   Total Bilirubin 0.3 - 1.2 mg/dL 0.7   Alkaline Phos 38 - 126 U/L 268   AST 15 - 41 U/L 31   ALT 0 - 44 U/L 21    Edinburgh Score:    09/01/2022    1:00 AM  Edinburgh Postnatal Depression Scale Screening Tool  I have been able to laugh and  see the funny side of things. 1  I have looked forward with enjoyment to things. 0  I have blamed myself unnecessarily when things went wrong. 2  I have been anxious or worried for no good reason. 2  I have felt scared or panicky for no good reason. 2  Things have been getting on top of me. 1  I have been so unhappy that I have had difficulty sleeping. 2  I have felt sad or miserable. 1  I have been so unhappy that I have been crying. 2  The thought of harming myself has occurred to me. 2  Edinburgh Postnatal Depression Scale Total 15      After visit meds:  Allergies as of 09/02/2022       Reactions   Asa [aspirin] Nausea Only   Food Itching, Swelling   Almond, cashew, pistachio   Latex Itching, Swelling   Nsaids Nausea Only   Peanut (diagnostic) Itching, Swelling        Medication List     STOP taking these medications    aspirin EC 81 MG tablet   ferrous sulfate 325 (65 FE) MG tablet       TAKE these medications     NIFEdipine 90 MG 24 hr tablet Commonly known as: PROCARDIA XL/NIFEDICAL-XL Take 1 tablet (90 mg total) by mouth daily. Start taking on: Sep 03, 2022   prenatal multivitamin Tabs tablet Take 1 tablet by mouth daily.         Discharge home in stable condition Infant Feeding: Breast Infant Disposition:NICU Discharge instruction: per After Visit Summary and Postpartum booklet. Activity: Advance as tolerated. Pelvic rest for 6 weeks.  Diet: routine diet Anticipated Birth Control: Unsure Postpartum Appointment:4 weeks Additional Postpartum F/U: BP check 2-3 days Future Appointments:No future appointments. Follow up Visit:  Follow-up Information     Marlow Baars, MD Follow up on 09/04/2022.   Specialty: Obstetrics Why: 5/31 at 2pm for BP check Contact information: [address] [place] [address] [place] [address] [phone]                     09/02/2022 [name] [name], MD

## 2022-09-03 ENCOUNTER — Ambulatory Visit (HOSPITAL_COMMUNITY): Payer: Self-pay

## 2022-09-03 NOTE — Lactation Note (Signed)
This note was copied from a baby's chart. Lactation Consultation Note  Patient Name: Rachel Brown [id number] Date: 09/03/2022 Age:41 days Reason for consult: Follow-up assessment;NICU baby;Term;1st time breastfeeding  P1, Request to assist.  Baby receiving bottle of donor milk when LC entered room.  Mother concerned about her milk supply and had attempted to latch with no success. Recommend mother pump a minimum of 8 times in 24 hours to stimulate her milk supply.  Mother has been pumping 3-5 times per day.  Discussed timing and frequency.  Assisted with latching first without NS.   Had mother prepump.  Applied #20 nipple shield and prefilled with donor milk.  After a few attempts baby was able to sustain latch for more than 10 min. Reassured mother it will take some time.     Maternal Data Has patient been taught Hand Expression?: Yes  Feeding Mother's Current Feeding Choice: Breast Milk and Donor Milk Nipple Type: Nfant Standard Flow (white)  LATCH Score Latch: Repeated attempts needed to sustain latch, nipple held in mouth throughout feeding, stimulation needed to elicit sucking reflex.  Audible Swallowing: A few with stimulation  Type of Nipple: Everted at rest and after stimulation  Comfort (Breast/Nipple): Soft / non-tender  Hold (Positioning): Assistance needed to correctly position infant at breast and maintain latch.  LATCH Score: 7   Lactation Tools Discussed/Used Tools: Pump;Flanges;Nipple Shields Nipple shield size: 20 Flange Size: 24 Breast pump type: Double-Electric Breast Pump;Manual Reason for Pumping: NICU  Interventions Interventions: Breast feeding basics reviewed;Assisted with latch;Skin to skin;Hand express;Pre-pump if needed;DEBP;Education;Position options  Discharge Pump: Personal  Consult Status Consult Status: NICU follow-up Date: 09/04/22 Follow-up type: In-patient    Dahlia Byes [facility] 09/03/2022, 4:00 PM

## 2022-09-04 ENCOUNTER — Ambulatory Visit (HOSPITAL_COMMUNITY): Payer: Self-pay

## 2022-09-04 NOTE — Lactation Note (Signed)
This note was copied from a baby's chart.  NICU Lactation Consultation Note  Patient Name: Rachel Brown [id number] Date: 09/04/2022 Age:40 days  Reason for consult: Follow-up assessment; NICU baby; Term; Primapara; 1st time breastfeeding  SUBJECTIVE  LC in to visit with P1 Mom of term baby on day of baby's discharge.  Mom currently double pumping and Milk flowing well.  Mom reports her milk volume is increasing.  Baby going to the breast, last feeding at 0900 was for 15 mins.  Mom using the nipple shield and is pumping AFTER breastfeeding to support her milk supply as baby is being supplemented also.  Mom desires OP lactation F/U, message sent to OP clinic.  OBJECTIVE Infant data: Mother's Current Feeding Choice: Breast Milk and Donor Milk  Infant feeding assessment Scale for Readiness: 1 Scale for Quality: 2   Maternal data: G1P1001  Vaginal, Spontaneous Has patient been taught Hand Expression?: Yes Pumped volume: 45 mL (45-60) Flange Size: 24  Pump: Personal  ASSESSMENT Infant: LATCH Documentation Latch: 1 Audible Swallowing: 1 Type of Nipple: 2 Comfort (Breast/Nipple): 2 Hold (Positioning): 1 LATCH Score: 7  Feeding Status: Ad lib  Maternal: Milk volume: Low  INTERVENTIONS/PLAN Interventions: Interventions: Skin to skin; Breast massage; Hand express; DEBP; Education Tools: Pump; Flanges; Bottle; Hands-free pumping top Nipple shield size: 20  Plan: Consult Status: Complete NICU Follow-up type: Baby's discharge   Judee Clara 09/04/2022, 1:26 PM

## 2022-09-08 ENCOUNTER — Telehealth (HOSPITAL_COMMUNITY): Payer: Self-pay

## 2022-09-08 NOTE — Telephone Encounter (Signed)
Patient reports feeling alright, tired, and taking tylenol to manage pain. She states that her bleeding has been good. Patient declines questions/concerns about her health and healing.  Patient reports that baby is doing well. Patient is pumping for infant. RN reviewed supply and demand with patient. Patient states she has an appointment soon for an outpatient lactation appointment. Baby is feeding well.  Baby sleeps in a bassinet. RN reviewed ABC's of safe sleep with patient. Patient declines any other questions or concerns about baby.  EPDS score is 11. Will notify Dr. Chestine Spore at [facility] of EPDS score via fax notification. Patient reports that she has a good support system. She states she has a therapist that she plans to set up an appointment with. RN told patient about Maternal Mental Health Resources ([facility], National Maternal Mental Health Hotline, [employer]). Also told patient about [facility] support group offerings. Will e-mail these resources to patient as well.  [facility]  Perinatal Services   09/08/22,2021

## 2022-09-17 ENCOUNTER — Telehealth (HOSPITAL_COMMUNITY): Payer: Self-pay

## 2022-09-17 NOTE — Telephone Encounter (Signed)
09/17/2022 1727  Name: MAKINZY CLEERE MRN: [id number] DOB: 1981/01/17  Reason for Call:  Transition of Care Hospital Discharge Call  Contact Status: Patient Contact Status: Unable to contact   Patient left RN a voicemail with some questions. Attempting to return patient's phone call. Patient did not answer phone call and no voicemail option is available.   Language assistant needed: Interpreter Mode: Interpreter Not Needed        Follow-Up Questions:    Inocente Salles Postnatal Depression Scale:  In the Past 7 Days:    PHQ2-9 Depression Scale:     Discharge Follow-up:    Post-discharge interventions: NA  Signature  Signe Colt

## 2022-09-19 ENCOUNTER — Telehealth (HOSPITAL_COMMUNITY): Payer: Self-pay

## 2022-09-19 NOTE — Telephone Encounter (Signed)
Patient left RN a voicemail to call her. Spoke with patient previously on 09/08/22 for D/C follow up call. RN called patient back today. Patient verbalizes that she is feeling very overwhelmed. She states that baby was recently readmitted to the hospital. She states that she is not able to sleep and is worried about the baby. She verbalizes feelings of wanting to run away and feeling very overwhelmed. Patient is tearful during our conversation. RN talked with patient about her support system. She states that she has not been able to see her therapist due to lack of time due to moving. RN discussed Maternal Mental Health resources with her. Patient declines feelings of self harm or wanting to harm others. RN reviewed going to [facility] if she is to have feelings of self harm or wanting to harm others. RN talked with patient about how is she is feeling and listened, verbal support provided.  RN called Dr. Reina Fuse and informed her of how patient is doing and my conversation with patient.  Dr. Reina Fuse states that they will follow up with patient.   Signe Colt  09/19/22,1931
